# Patient Record
Sex: Male | Born: 1980 | Race: White | Hispanic: No | Marital: Married | Smoking: Former smoker
Health system: Southern US, Community
[De-identification: ages and names within clinical notes are randomized; demographics above are authoritative.]

## PROBLEM LIST (undated history)

## (undated) DIAGNOSIS — K219 Gastro-esophageal reflux disease without esophagitis: Secondary | ICD-10-CM

## (undated) DIAGNOSIS — I10 Essential (primary) hypertension: Secondary | ICD-10-CM

## (undated) DIAGNOSIS — Z8619 Personal history of other infectious and parasitic diseases: Secondary | ICD-10-CM

## (undated) DIAGNOSIS — J3089 Other allergic rhinitis: Secondary | ICD-10-CM

## (undated) DIAGNOSIS — J45909 Unspecified asthma, uncomplicated: Secondary | ICD-10-CM

## (undated) HISTORY — DX: Gastro-esophageal reflux disease without esophagitis: K21.9

## (undated) HISTORY — DX: Personal history of other infectious and parasitic diseases: Z86.19

## (undated) HISTORY — DX: Unspecified asthma, uncomplicated: J45.909

## (undated) HISTORY — PX: FOOT SURGERY: SHX648

## (undated) HISTORY — DX: Other allergic rhinitis: J30.89

---
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
## 2002-07-16 ENCOUNTER — Ambulatory Visit (HOSPITAL_BASED_OUTPATIENT_CLINIC_OR_DEPARTMENT_OTHER): Admission: RE | Admit: 2002-07-16 | Discharge: 2002-07-16 | Payer: Self-pay | Admitting: Orthopedic Surgery

## 2010-01-25 ENCOUNTER — Emergency Department (HOSPITAL_COMMUNITY): Admission: EM | Admit: 2010-01-25 | Discharge: 2010-01-25 | Payer: Self-pay | Admitting: Emergency Medicine

## 2010-02-04 ENCOUNTER — Emergency Department (HOSPITAL_COMMUNITY): Admission: EM | Admit: 2010-02-04 | Discharge: 2010-02-04 | Payer: Self-pay | Admitting: Emergency Medicine

## 2010-08-13 NOTE — Op Note (Signed)
NAME:  ADAIAH, MORKEN                        ACCOUNT NO.:  [id number]   MEDICAL RECORD NO.:  [id number]                   PATIENT TYPE:  AMB   LOCATION:  DSC                                  FACILITY:  [facility]   PHYSICIAN:  Leonides Grills, M.D.                  DATE OF BIRTH:  [birth year]   DATE OF PROCEDURE:  07/16/2002  DATE OF DISCHARGE:                                 OPERATIVE REPORT   PREOPERATIVE DIAGNOSES:  1. Right peroneus longus tendinitis.  2. Right peroneus brevis tendinitis.  3. Right calcaneal spur.   POSTOPERATIVE DIAGNOSES:  1. Right peroneus longus tendinitis.  2. Right peroneus brevis tendinitis.  3. Right calcaneal spur.   OPERATION:  1. Right peroneus longus tenosynovectomy.  2. Right peroneus brevis tenosynovectomy.  3. Excision of calcaneal peroneal tubercle.  4. Sural nerve neurolysis.   ANESTHESIA:  General endotracheal tube.   SURGEON:  Leonides Grills, M.D.   ASSISTANT:  Lianne Cure, P.A.-C.   ESTIMATED BLOOD LOSS:  Minimal.   TOURNIQUET TIME:  Approximately one hour.   COMPLICATIONS:  None.   DISPOSITION:  Stable to the postoperative recovery room.   INDICATIONS FOR PROCEDURE:  This is a 30 year old male who has had  longstanding lateral ankle pain which has been resistant to conservative  measures.  He was consented for the above procedures.  All risks, which  include infection, neurovascular injury, especially sural nerve neuroma,  persistent pain, worsening of pain, subluxation of the peroneal tendons, and  possible future care were all again explained and questions encouraged and  answered.   DESCRIPTION OF PROCEDURE:  The patient was brought to the operating room and  placed initially in the supine position.  After this general endotracheal  tube anesthesia was administered, as well as vancomycin 1 g IV piggyback.  The patient was then placed in a sloppy lateral position with a bean bag.  All bony prominences were well-padded.  The  right lower extremity was then  prepped and draped in a sterile manner over a proximally-placed thigh  tourniquet.  The limb was gravity-exsanguinated.  The tourniquet was  elevated to 290 mmHg.  A longitudinal incision over the peroneal tendon,  over the peroneal tubercle was then made.  Dissection was carried down  through the skin.  The sural nerve was identified and meticulously dissected  out, performing a neurolysis of the nerve.  Once this was done, and  retracted out of harm's way, the peroneal retinaculum was then encountered.  A longitudinal incision within the peroneal retinaculum was then made.  The  peroneus longus was first identified.  This was retracted out of harm's way.  There was a prominent tubercle that was palpable underneath the tendon.  There was a large amount of synovitis in this area, and this was  meticulously debrided.  The retinaculum was then opened over the peroneus  brevis.  Again there was a  large amount of synovitis in this area as well.  This was meticulously debrided as well.  The peroneal tubercle was then  removed with the aid of a curved 1/4 inch osteotome.  Once this was removed,  all edges were well-rounded with the rongeur.  Once this was done, the wound  was copiously irrigated with normal saline.  Once this was meticulously  irrigated, we then closed the retinaculum with #3-0 Vicryl.  The tourniquet  was deflated and hemostasis was obtained.  The skin was closed with #4-0  nylon.  Sterile dressings were applied.  A Cam walker boot was applied.  The patient was started to the postoperative recovery room.                                                 Leonides Grills, M.D.    PB/MEDQ  D:  07/16/2002  T:  07/16/2002  Job:  [id number]

## 2010-11-21 ENCOUNTER — Emergency Department (HOSPITAL_COMMUNITY)
Admission: EM | Admit: 2010-11-21 | Discharge: 2010-11-22 | Disposition: A | Discharge status: Home / Self Care | Payer: Self-pay | Attending: Emergency Medicine | Admitting: Emergency Medicine

## 2010-11-21 DIAGNOSIS — R05 Cough: Secondary | ICD-10-CM | POA: Insufficient documentation

## 2010-11-21 DIAGNOSIS — J45909 Unspecified asthma, uncomplicated: Secondary | ICD-10-CM | POA: Insufficient documentation

## 2010-11-21 DIAGNOSIS — R059 Cough, unspecified: Secondary | ICD-10-CM | POA: Insufficient documentation

## 2010-11-22 ENCOUNTER — Emergency Department (HOSPITAL_COMMUNITY): Payer: Self-pay

## 2011-03-29 HISTORY — PX: HAND SURGERY: SHX662

## 2011-07-16 ENCOUNTER — Emergency Department (INDEPENDENT_AMBULATORY_CARE_PROVIDER_SITE_OTHER): Payer: Self-pay

## 2011-07-16 ENCOUNTER — Encounter (HOSPITAL_COMMUNITY): Payer: Self-pay | Admitting: *Deleted

## 2011-07-16 ENCOUNTER — Emergency Department (INDEPENDENT_AMBULATORY_CARE_PROVIDER_SITE_OTHER)
Admission: EM | Admit: 2011-07-16 | Discharge: 2011-07-16 | Disposition: A | Discharge status: Home / Self Care | Payer: Self-pay | Source: Home / Self Care | Attending: Family Medicine | Admitting: Family Medicine

## 2011-07-16 DIAGNOSIS — S62339A Displaced fracture of neck of unspecified metacarpal bone, initial encounter for closed fracture: Secondary | ICD-10-CM

## 2011-07-16 MED ORDER — HYDROCODONE-ACETAMINOPHEN 5-500 MG PO TABS: 1.0000 | ORAL_TABLET | Freq: Four times a day (QID) | ORAL | Status: AC | PRN

## 2011-07-16 NOTE — ED Notes (Signed)
Pt injured at work last night lifting heavy pallet wood broke and posterior left hand hit wood now with swelling left hand

## 2011-07-16 NOTE — ED Provider Notes (Signed)
History     CSN: [id number]  Arrival date & time 07/16/11  1158   First MD Initiated Contact with Patient 07/16/11 1201      Chief Complaint  Patient presents with  . Hand Pain  . Hand Injury    (Consider location/radiation/quality/duration/timing/severity/associated sxs/prior treatment) Patient is a 31 y.o. male presenting with hand pain and hand injury. The history is provided by the patient.  Hand Pain This is a new problem. The current episode started yesterday (struck hand against wood palate on job last eve). The problem has been gradually worsening.  Hand Injury     Past Medical History  Diagnosis Date  . Environmental allergies   . Asthma     Past Surgical History  Procedure Date  . Wrist surgery   . Foot surgery     History reviewed. No pertinent family history.  History  Substance Use Topics  . Smoking status: Never Smoker   . Smokeless tobacco: Not on file  . Alcohol Use: Yes      Review of Systems  Constitutional: Negative.   Musculoskeletal: Positive for joint swelling.    Allergies  Penicillins  Home Medications   Current Outpatient Rx  Name Route Sig Dispense Refill  . IBUPROFEN 200 MG PO TABS Oral Take 600 mg by mouth every 6 (six) hours as needed.    Marland Kitchen LORATADINE 10 MG PO TABS Oral Take 10 mg by mouth daily.    . MOMETASONE FUROATE 220 MCG/INH IN AEPB Inhalation Inhale 2 puffs into the lungs daily.    Marland Kitchen HYDROCODONE-ACETAMINOPHEN 5-500 MG PO TABS Oral Take 1-2 tablets by mouth every 6 (six) hours as needed for pain. 15 tablet 0    BP 145/93  Pulse 80  Temp(Src) 99.2 F (37.3 C) (Oral)  Resp 17  SpO2 96%  Physical Exam  Nursing note and vitals reviewed. Musculoskeletal: He exhibits tenderness.       Tender hematoma sts over 5th mc of left hand, , skin intact, fingers intact.    ED Course  Procedures (including critical care time)  Labs Reviewed - No data to display Dg Hand Complete Left  07/16/2011  *RADIOLOGY REPORT*   Clinical Data: The patient hit hand at work.  Pain, soft tissue swelling fifth metacarpal phalangeal joint.  LEFT HAND - COMPLETE 3+ VIEW  Comparison: None.  Findings: There is a fracture of the distal fifth metacarpal with anterior angulation at the fracture site.  There is associated soft tissue swelling.  No radiopaque foreign body or soft tissue gas.  IMPRESSION: Fracture of the fifth metacarpal.  Original Report Authenticated By: Patterson Hammersmith, M.D.     1. Boxer's metacarpal fracture, neck, closed       MDM  X-rays reviewed and report per radiologist.  Discussed with dr Melvyn Novas, ,treat as advised.         Linna Hoff, MD 07/16/11 [phone]

## 2012-04-06 ENCOUNTER — Emergency Department (HOSPITAL_COMMUNITY)
Admission: EM | Admit: 2012-04-06 | Discharge: 2012-04-06 | Disposition: A | Discharge status: Home / Self Care | Payer: Managed Care, Other (non HMO) | Attending: Emergency Medicine | Admitting: Emergency Medicine

## 2012-04-06 ENCOUNTER — Encounter (HOSPITAL_COMMUNITY): Payer: Self-pay | Admitting: Emergency Medicine

## 2012-04-06 DIAGNOSIS — J45909 Unspecified asthma, uncomplicated: Secondary | ICD-10-CM | POA: Insufficient documentation

## 2012-04-06 DIAGNOSIS — IMO0002 Reserved for concepts with insufficient information to code with codable children: Secondary | ICD-10-CM | POA: Insufficient documentation

## 2012-04-06 DIAGNOSIS — H109 Unspecified conjunctivitis: Secondary | ICD-10-CM | POA: Insufficient documentation

## 2012-04-06 DIAGNOSIS — Z79899 Other long term (current) drug therapy: Secondary | ICD-10-CM | POA: Insufficient documentation

## 2012-04-06 MED ORDER — FLUORESCEIN SODIUM 1 MG OP STRP
1.0000 | ORAL_STRIP | Freq: Once | OPHTHALMIC | Status: AC
  Administered 2012-04-06: 1 via OPHTHALMIC
  Filled 2012-04-06: qty 1

## 2012-04-06 MED ORDER — TETRACAINE HCL 0.5 % OP SOLN
2.0000 [drp] | Freq: Once | OPHTHALMIC | Status: AC
  Administered 2012-04-06: 2 [drp] via OPHTHALMIC
  Filled 2012-04-06: qty 2

## 2012-04-06 MED ORDER — POLYMYXIN B-TRIMETHOPRIM 10000-0.1 UNIT/ML-% OP SOLN
1.0000 [drp] | Freq: Once | OPHTHALMIC | Status: AC
  Administered 2012-04-06: 1 [drp] via OPHTHALMIC
  Filled 2012-04-06 (×2): qty 10

## 2012-04-06 NOTE — ED Notes (Signed)
Pt st's he woke up this am with pain to left eye.  St's eye has been watering, painful to open

## 2012-04-06 NOTE — ED Provider Notes (Signed)
History   This chart was scribed for non-physician practitioner working with Suzi Roots, MD by Frederik Pear, ED Scribe. This patient was seen in room TR04C/TR04C and the patient's care was started at 1753.   CSN: [id number]  Arrival date & time 04/06/12  1714   First MD Initiated Contact with Patient 04/06/12 1753      Chief Complaint  Patient presents with  . Eye Pain    (Consider location/radiation/quality/duration/timing/severity/associated sxs/prior treatment) Patient is a 32 y.o. male presenting with eye pain. The history is provided by the patient. No language interpreter was used.  Eye Pain This is a new problem. The current episode started 6 to 12 hours ago. The problem occurs constantly. The problem has not changed since onset.Exacerbated by: Opening his eye. Nothing relieves the symptoms. He has tried nothing for the symptoms.    Shawn Davenport is a 32 y.o. male who presents to the Emergency Department complaining of constant, moderate left eye pain with associated watering and swelling that is aggravated by opening his eye and began when he awoke. He denies any known injury, headaches, visual disturbances, or that the eye was matted shut when he awoke. He denies recently being sick or having any sick contacts.  Past Medical History  Diagnosis Date  . Environmental allergies   . Asthma     Past Surgical History  Procedure Date  . Wrist surgery   . Foot surgery     No family history on file.  History  Substance Use Topics  . Smoking status: Never Smoker   . Smokeless tobacco: Not on file  . Alcohol Use: Yes      Review of Systems  Eyes: Positive for pain.  All other systems reviewed and are negative.    Allergies  Penicillins  Home Medications   Current Outpatient Rx  Name  Route  Sig  Dispense  Refill  . ALBUTEROL SULFATE HFA 108 (90 BASE) MCG/ACT IN AERS   Inhalation   Inhale 2 puffs into the lungs every 6 (six) hours as needed. For  wheezing/shortness of breath         . IBUPROFEN 200 MG PO TABS   Oral   Take 600 mg by mouth every 6 (six) hours as needed. For eye pain         . LORATADINE 10 MG PO TABS   Oral   Take 10 mg by mouth daily.         . MOMETASONE FURO-FORMOTEROL FUM 100-5 MCG/ACT IN AERO   Inhalation   Inhale 2 puffs into the lungs 2 (two) times daily.           BP 158/103  Pulse 75  Temp 98.3 F (36.8 C) (Oral)  Resp 16  SpO2 97%  Physical Exam  Nursing note and vitals reviewed. Constitutional: He is oriented to person, place, and time. He appears well-developed and well-nourished. No distress.  HENT:  Head: Normocephalic and atraumatic.  Eyes: EOM are normal. Pupils are equal, round, and reactive to light. Left conjunctiva is injected.       No abrasion are noted.  Neck: Neck supple. No tracheal deviation present.  Cardiovascular: Normal rate.   Pulmonary/Chest: Effort normal. No respiratory distress.  Musculoskeletal: Normal range of motion.  Neurological: He is alert and oriented to person, place, and time.  Skin: Skin is warm and dry.  Psychiatric: He has a normal mood and affect. His behavior is normal.    ED Course  Procedures (including critical care time)  DIAGNOSTIC STUDIES: Oxygen Saturation is 97% on room air, adequate by my interpretation.    COORDINATION OF CARE:  17:58- Discussed planned course of treatment with the patient, including including eye drops, who is agreeable at this time.  18:00- Medication Orders- tetracaine (pontocaine) 0.5% opthalmic solution 2 drop- once, fluorescein opthalmic strip 1 strip- once.  Labs Reviewed - No data to display No results found.   No diagnosis found.    MDM    I personally performed the services described in this documentation, which was scribed in my presence. The recorded information has been reviewed and is accurate.       Jimmye Norman, NP 04/07/12 0130

## 2012-04-06 NOTE — ED Notes (Signed)
Pt reports watering, itchy eye since this morning. Pain when opening eye. Denies any injury or trauma to eye. Vision intact. Reports this occurred earlier this week, then resolved on its own.

## 2012-04-08 NOTE — ED Provider Notes (Signed)
Medical screening examination/treatment/procedure(s) were performed by non-physician practitioner and as supervising physician I was immediately available for consultation/collaboration.   Suzi Roots, MD 04/08/12 1520

## 2013-07-04 ENCOUNTER — Encounter: Payer: Self-pay | Admitting: Family Medicine

## 2013-07-04 ENCOUNTER — Ambulatory Visit (INDEPENDENT_AMBULATORY_CARE_PROVIDER_SITE_OTHER): Payer: Managed Care, Other (non HMO) | Admitting: Family Medicine

## 2013-07-04 VITALS — BP 144/90 | HR 76 | Temp 98.3°F | Ht 68.75 in | Wt 245.5 lb

## 2013-07-04 DIAGNOSIS — J3089 Other allergic rhinitis: Secondary | ICD-10-CM

## 2013-07-04 DIAGNOSIS — J309 Allergic rhinitis, unspecified: Secondary | ICD-10-CM

## 2013-07-04 DIAGNOSIS — L255 Unspecified contact dermatitis due to plants, except food: Secondary | ICD-10-CM

## 2013-07-04 DIAGNOSIS — R030 Elevated blood-pressure reading, without diagnosis of hypertension: Secondary | ICD-10-CM

## 2013-07-04 DIAGNOSIS — L237 Allergic contact dermatitis due to plants, except food: Secondary | ICD-10-CM | POA: Insufficient documentation

## 2013-07-04 DIAGNOSIS — J45909 Unspecified asthma, uncomplicated: Secondary | ICD-10-CM

## 2013-07-04 DIAGNOSIS — IMO0001 Reserved for inherently not codable concepts without codable children: Secondary | ICD-10-CM | POA: Insufficient documentation

## 2013-07-04 DIAGNOSIS — E669 Obesity, unspecified: Secondary | ICD-10-CM | POA: Insufficient documentation

## 2013-07-04 MED ORDER — TRIAMCINOLONE ACETONIDE 0.1 % EX CREA: 1.0000 "application " | TOPICAL_CREAM | Freq: Two times a day (BID) | CUTANEOUS | Status: DC

## 2013-07-04 NOTE — Progress Notes (Signed)
Pre visit review using our clinic review tool, if applicable. No additional management support is needed unless otherwise documented below in the visit note. 

## 2013-07-04 NOTE — Assessment & Plan Note (Signed)
Continue claritin daily and nasonex prn.

## 2013-07-04 NOTE — Assessment & Plan Note (Signed)
Chronic, stable. Continue dulera and albuterol prn. [place]

## 2013-07-04 NOTE — Progress Notes (Signed)
BP 142/90  Pulse 76  Temp(Src) 98.3 F (36.8 C) (Oral)  Ht 5' 8.75" (1.746 m)  Wt 245 lb 8 oz (111.358 kg)  BMI 36.53 kg/m2  SpO2 98%   CC: new pt to establish  Subjective:    Patient ID: Shawn Davenport, male    DOB: [birth year], 33 y.o.   MRN: [id number]  HPI: Shawn Davenport is a 33 y.o. male presenting on 07/04/2013 for Establish Care   Asthma - on prn albuterol as well as daily dulera.  Sees asthma/allergy center in [place].  Recent asthma flare - with current URI.  No fevers/chills.  No nocturnal sxs.  Triggers are hairspray and allergens (ragweed, dogs cats and mold) Allergic rhinitis - takes nasonex prn as well as claritin 10mg  daily. HTN - last few times he has seen asthma doc, bp has been elevated.  Up to 150/101. Currently with poison ivy - bilateral forearms.  Recent yardwork at home.  Obesity - noted weight loss over last year.  Weight as high as 230lb, last weight 1 yr ago 220lbs. Wt Readings from Last 3 Encounters:  07/04/13 245 lb 8 oz (111.358 kg)   Body mass index is 36.53 kg/(m^2).   Preventative: No recent CPE Flu shot - did not receive Tetanus shot - unsure  Lives with wife and daughter (2013), 1 dog Occupation: Therapist, musicunloader for [employer] Edu: HS Activity - yardwork Diet - good water, gatorade 16 oz at work, fruits/vegetables daily  Relevant past medical, surgical, family and social history reviewed and updated as indicated.  Allergies and medications reviewed and updated. Current Outpatient Prescriptions on File Prior to Visit  Medication Sig  . albuterol (PROVENTIL HFA;VENTOLIN HFA) 108 (90 BASE) MCG/ACT inhaler Inhale 2 puffs into the lungs every 6 (six) hours as needed. For wheezing/shortness of breath  . ibuprofen (ADVIL,MOTRIN) 200 MG tablet Take 600 mg by mouth every 6 (six) hours as needed. For eye pain  . loratadine (CLARITIN) 10 MG tablet Take 10 mg by mouth daily.  . mometasone-formoterol (DULERA) 100-5 MCG/ACT AERO Inhale 2 puffs into the  lungs 2 (two) times daily.   No current facility-administered medications on file prior to visit.    Review of Systems Per HPI unless specifically indicated above    Objective:    BP 142/90  Pulse 76  Temp(Src) 98.3 F (36.8 C) (Oral)  Ht 5' 8.75" (1.746 m)  Wt 245 lb 8 oz (111.358 kg)  BMI 36.53 kg/m2  SpO2 98%  Physical Exam  Nursing note and vitals reviewed. Constitutional: He appears well-developed and well-nourished. No distress.  obese  HENT:  Head: Normocephalic and atraumatic.  Mouth/Throat: Oropharynx is clear and moist. No oropharyngeal exudate.  Eyes: Conjunctivae and EOM are normal. Pupils are equal, round, and reactive to light. No scleral icterus.  Neck: Normal range of motion. Neck supple. Carotid bruit is not present. No thyromegaly present.  Cardiovascular: Normal rate, regular rhythm, normal heart sounds and intact distal pulses.   No murmur heard. Pulmonary/Chest: Breath sounds normal. No respiratory distress. He has no wheezes. He has no rales.  Musculoskeletal: He exhibits no edema.  Lymphadenopathy:    He has no cervical adenopathy.  Skin: Skin is warm and dry. No rash noted.  Psychiatric: He has a normal mood and affect.   No results found for this or any previous visit.    Assessment & Plan:   Problem List Items Addressed This Visit   Poison ivy dermatitis - Primary  TCI cream today. Pt education handout provided.    Relevant Medications      [email or web address]   Perennial allergic rhinitis     Continue claritin daily and nasonex prn.    Asthma     Chronic, stable. Continue dulera and albuterol prn. Followed by allergy/asthma center in [place].    Elevated blood pressure     Reviewed low salt diet, discussed DASH and mediterranean diet, pt education handout provided. Reviewed recommended amt daily exercise 163min/wk.  Discussed slow titration up to goal.. rtc 3-4 mo for f/u blood pressure after healthy diet/lifestyle changes. See  pt instructions for plan.    Obesity     Encouraged weight loss through healthy diet/lifesytle changes.        Follow up plan: Return in about 3 months (around 10/03/2013), or as needed, for follow up.

## 2013-07-04 NOTE — Assessment & Plan Note (Signed)
Reviewed low salt diet, discussed DASH and mediterranean diet, pt education handout provided. Reviewed recommended amt daily exercise 14750min/wk.  Discussed slow titration up to goal.. rtc 3-4 mo for f/u blood pressure after healthy diet/lifestyle changes. See pt instructions for plan.

## 2013-07-04 NOTE — Patient Instructions (Addendum)
For poison ivy - use triamcinolone cream prescribed today.  May also use benadryl over the counter for night time itching. Your goal blood pressure is <140/90. Work on low salt/sodium diet - goal <1.5gm (1,500mg ) per day. Eat a diet high in fruits/vegetables and whole grains.  Look into mediterranean diet and DASH diet. Goal activity is 13250min/wk of moderate intensity exercise.  This can be split into 30 minute chunks.  If you are not at this level, you can start with smaller 10-15 min increments and slowly build up activity. Look at www.heart.org for more resources Return in 3-4 months for blood pressure recheck - if staying elevated, may discuss medicines for this.  Poison [employer] Poison ivy is a inflammation of the skin (contact dermatitis) caused by touching the allergens on the leaves of the ivy plant following previous exposure to the plant. The rash usually appears 48 hours after exposure. The rash is usually bumps (papules) or blisters (vesicles) in a linear pattern. Depending on your own sensitivity, the rash may simply cause redness and itching, or it may also progress to blisters which may break open. These must be well cared for to prevent secondary bacterial (germ) infection, followed by scarring. Keep any open areas dry, clean, dressed, and covered with an antibacterial ointment if needed. The eyes may also get puffy. The puffiness is worst in the morning and gets better as the day progresses. This dermatitis usually heals without scarring, within 2 to 3 weeks without treatment. HOME CARE INSTRUCTIONS  Thoroughly wash with soap and water as soon as you have been exposed to poison ivy. You have about one half hour to remove the plant resin before it will cause the rash. This washing will destroy the oil or antigen on the skin that is causing, or will cause, the rash. Be sure to wash under your fingernails as any plant resin there will continue to spread the rash. Do not rub skin vigorously when  washing affected area. Poison ivy cannot spread if no oil from the plant remains on your body. A rash that has progressed to weeping sores will not spread the rash unless you have not washed thoroughly. It is also important to wash any clothes you have been wearing as these may carry active allergens. The rash will return if you wear the unwashed clothing, even several days later. Avoidance of the plant in the future is the best measure. Poison ivy plant can be recognized by the number of leaves. Generally, poison ivy has three leaves with flowering branches on a single stem. Diphenhydramine may be purchased over the counter and used as needed for itching. Do not drive with this medication if it makes you drowsy.Ask your caregiver about medication for children. SEEK MEDICAL CARE IF:  Open sores develop.  Redness spreads beyond area of rash.  You notice purulent (pus-like) discharge.  You have increased pain.  Other signs of infection develop (such as fever). Document Released: 03/11/2000 Document Revised: 06/06/2011 Document Reviewed: 01/28/2009 [facility] Patient Information 2014 [place].  DASH Diet The DASH diet stands for "Dietary Approaches to Stop Hypertension." It is a healthy eating plan that has been shown to reduce high blood pressure (hypertension) in as little as 14 days, while also possibly providing other significant health benefits. These other health benefits include reducing the risk of breast cancer after menopause and reducing the risk of type 2 diabetes, heart disease, colon cancer, and stroke. Health benefits also include weight loss and slowing kidney failure in  patients with chronic kidney disease.  DIET GUIDELINES  Limit salt (sodium). Your diet should contain less than 1500 mg of sodium daily.  Limit refined or processed carbohydrates. Your diet should include mostly whole grains. Desserts and added sugars should be used sparingly.  Include small amounts of  heart-healthy fats. These types of fats include nuts, oils, and tub margarine. Limit saturated and trans fats. These fats have been shown to be harmful in the body. CHOOSING FOODS  The following food groups are based on a 2000 calorie diet. See your Registered Dietitian for individual calorie needs. Grains and Grain Products (6 to 8 servings daily)  Eat More Often: Whole-wheat bread, brown rice, whole-grain or wheat pasta, quinoa, popcorn without added fat or salt (air popped).  Eat Less Often: White bread, white pasta, white rice, cornbread. Vegetables (4 to 5 servings daily)  Eat More Often: Fresh, frozen, and canned vegetables. Vegetables may be raw, steamed, roasted, or grilled with a minimal amount of fat.  Eat Less Often/Avoid: Creamed or fried vegetables. Vegetables in a cheese sauce. Fruit (4 to 5 servings daily)  Eat More Often: All fresh, canned (in natural juice), or frozen fruits. Dried fruits without added sugar. One hundred percent fruit juice ( cup [237 mL] daily).  Eat Less Often: Dried fruits with added sugar. Canned fruit in light or heavy syrup. [employer], Fish, and Poultry (2 servings or less daily. One serving is 3 to 4 oz [85-114 g]).  Eat More Often: Ninety percent or leaner ground beef, tenderloin, sirloin. Round cuts of beef, chicken breast, [place] breast. All fish. Grill, bake, or broil your meat. Nothing should be fried.  Eat Less Often/Avoid: Fatty cuts of meat, [place], or chicken leg, thigh, or wing. Fried cuts of meat or fish. Dairy (2 to 3 servings)  Eat More Often: Low-fat or fat-free milk, low-fat plain or light yogurt, reduced-fat or part-skim cheese.  Eat Less Often/Avoid: Milk (whole, 2%).Whole milk yogurt. Full-fat cheeses. Nuts, Seeds, and Legumes (4 to 5 servings per week)  Eat More Often: All without added salt.  Eat Less Often/Avoid: Salted nuts and seeds, canned beans with added salt. Fats and Sweets (limited)  Eat More Often: Vegetable  oils, tub margarines without trans fats, sugar-free gelatin. Mayonnaise and salad dressings.  Eat Less Often/Avoid: Coconut oils, palm oils, butter, stick margarine, cream, half and half, cookies, candy, pie. FOR MORE INFORMATION The Dash Diet Eating Plan: www.dashdiet.org Document Released: 03/03/2011 Document Revised: 06/06/2011 Document Reviewed: 03/03/2011 [facility] Patient Information 2014 [place], [place].

## 2013-07-04 NOTE — Assessment & Plan Note (Signed)
TCI cream today. Pt education handout provided.

## 2013-07-04 NOTE — Assessment & Plan Note (Signed)
Encouraged weight loss through healthy diet/lifesytle changes.

## 2013-09-09 ENCOUNTER — Ambulatory Visit (INDEPENDENT_AMBULATORY_CARE_PROVIDER_SITE_OTHER)
Admission: RE | Admit: 2013-09-09 | Discharge: 2013-09-09 | Disposition: A | Discharge status: Home / Self Care | Payer: Managed Care, Other (non HMO) | Source: Ambulatory Visit | Attending: Family Medicine | Admitting: Family Medicine

## 2013-09-09 ENCOUNTER — Encounter: Payer: Self-pay | Admitting: *Deleted

## 2013-09-09 ENCOUNTER — Encounter: Payer: Self-pay | Admitting: Family Medicine

## 2013-09-09 ENCOUNTER — Ambulatory Visit (INDEPENDENT_AMBULATORY_CARE_PROVIDER_SITE_OTHER): Payer: Managed Care, Other (non HMO) | Admitting: Family Medicine

## 2013-09-09 VITALS — BP 120/80 | HR 68 | Temp 98.5°F | Ht 68.75 in | Wt 232.0 lb

## 2013-09-09 DIAGNOSIS — M79609 Pain in unspecified limb: Secondary | ICD-10-CM

## 2013-09-09 DIAGNOSIS — S93519A Sprain of interphalangeal joint of unspecified toe(s), initial encounter: Secondary | ICD-10-CM

## 2013-09-09 DIAGNOSIS — M79676 Pain in unspecified toe(s): Secondary | ICD-10-CM

## 2013-09-09 NOTE — Progress Notes (Signed)
Pre visit review using our clinic review tool, if applicable. No additional management support is needed unless otherwise documented below in the visit note. 

## 2013-09-09 NOTE — Progress Notes (Signed)
[address] [place] [address] Phone: [phone] Fax: [phone]  Patient ID: Burnadette PopDavid M Hallisey MRN: [id number], DOB: 07-23-1980, 33 y.o. Date of Encounter: 09/09/2013  Primary Physician:  Eustaquio BoydenJavier Gutierrez, MD   Chief Complaint: pain left big toe   Subjective:   History of Present Illness:  Burnadette PopDavid M Medaglia is a 33 y.o. very pleasant male patient who presents with the following:  L great toe. Has been exercising a lot more and one day it was a lot sore. Got a little sore. 2 weeks. He does not have any known he has been where he stubbed his toe or dropped anything on it at all. He does have some slight degree of bruising on the medial aspect. It is tender little bit her somewhat when he is walking.  Past Medical History, Surgical History, Social History, Family History, Problem List, Medications, and Allergies have been reviewed and updated if relevant.  Review of Systems:  GEN: No fevers, chills. Nontoxic. Primarily MSK c/o today. MSK: Detailed in the HPI GI: tolerating PO intake without difficulty Neuro: No numbness, parasthesias, or tingling associated. Otherwise the pertinent positives of the ROS are noted above.   Objective:   Physical Examination: BP 120/80  Pulse 68  Temp(Src) 98.5 F (36.9 C) (Oral)  Ht 5' 8.75" (1.746 m)  Wt 232 lb (105.235 kg)  BMI 34.52 kg/m2   GEN: WDWN, NAD, Non-toxic, Alert & Oriented x 3 HEENT: Atraumatic, Normocephalic.  Ears and Nose: No external deformity. EXTR: No clubbing/cyanosis/edema NEURO: Normal gait.  PSYCH: Normally interactive. Conversant. Not depressed or anxious appearing.  Calm demeanor.    The entirety of the ankle including all bony and soft tissue structures is nontender. The entire forefoot is nontender. The entire midfoot is nontender. Isolated tenderness only at the IP joint of the great toe on the LEFT. This is more notable medially and there is some bruising here as well. Flexion and extension are  intact.  Radiology: Dg Toe Great Left  09/09/2013   CLINICAL DATA:  Great toe pain  EXAM: LEFT GREAT TOE  COMPARISON:  None.  FINDINGS: There is no evidence of fracture or dislocation. There is no evidence of arthropathy or other focal bone abnormality. Soft tissues are unremarkable.  IMPRESSION: Negative.   Electronically Signed   By: Elige KoHetal  Patel   On: 09/09/2013 15:02    Assessment & Plan:   Interphalangeal (joint), toe sprain  Great toe pain - Plan: DG Toe Great Left  I do not have a good mechanism, but the patient and his pain isolate out to the IP joint of his 1st toe on the LEFT. This occurred approximately 2 weeks ago. It seems to be more medial compared to lateral when stressed, but all ligaments are intact. I reassured him, and he should improve with minimal effort on his part. I did buddy tape his toes on the 1st and 2nd together for comfort and gave him some tape.  Additionally, gave him some poor on to pad and make a bar around his 1st toe for comfort underneath his insole, which I think will make it more comfortable when he is working.  New Prescriptions   No medications on file   Modified Medications   No medications on file   Orders Placed This Encounter  Procedures  . DG Toe Great Left   Follow-up: No Follow-up on file. Unless noted above, the patient is to follow-up if symptoms worsen. Red flags were reviewed with the patient.  Signed,  Elpidio GaleaSpencer T. Tarhonda Hollenberg, MD, CAQ Sports Medicine   Discontinued Medications   No medications on file   Current Medications at Discharge:   Medication List       This list is accurate as of: 09/09/13 11:59 PM.  Always use your most recent med list.               albuterol 108 (90 BASE) MCG/ACT inhaler  Commonly known as:  PROVENTIL HFA;VENTOLIN HFA  Inhale 2 puffs into the lungs every 6 (six) hours as needed. For wheezing/shortness of breath     ibuprofen 200 MG tablet  Commonly known as:  ADVIL,MOTRIN  Take 600 mg by  mouth every 6 (six) hours as needed. For eye pain     loratadine 10 MG tablet  Commonly known as:  CLARITIN  Take 10 mg by mouth daily.     mometasone 50 MCG/ACT nasal spray  Commonly known as:  NASONEX  Place 2 sprays into the nose as needed.     mometasone-formoterol 100-5 MCG/ACT Aero  Commonly known as:  DULERA  Inhale 2 puffs into the lungs 2 (two) times daily.     triamcinolone cream 0.1 %  Commonly known as:  KENALOG  Apply 1 application topically 2 (two) times daily. Apply to AA.

## 2013-10-03 ENCOUNTER — Ambulatory Visit: Payer: Self-pay | Admitting: Family Medicine

## 2013-10-07 ENCOUNTER — Encounter: Payer: Self-pay | Admitting: Family Medicine

## 2013-10-07 ENCOUNTER — Ambulatory Visit (INDEPENDENT_AMBULATORY_CARE_PROVIDER_SITE_OTHER): Payer: Managed Care, Other (non HMO) | Admitting: Family Medicine

## 2013-10-07 VITALS — BP 130/92 | HR 64 | Temp 98.1°F | Wt 230.5 lb

## 2013-10-07 DIAGNOSIS — IMO0001 Reserved for inherently not codable concepts without codable children: Secondary | ICD-10-CM

## 2013-10-07 DIAGNOSIS — R030 Elevated blood-pressure reading, without diagnosis of hypertension: Secondary | ICD-10-CM

## 2013-10-07 DIAGNOSIS — E669 Obesity, unspecified: Secondary | ICD-10-CM

## 2013-10-07 NOTE — Progress Notes (Signed)
   BP 130/92  Pulse 64  Temp(Src) 98.1 F (36.7 C) (Oral)  Wt 230 lb 8 oz (104.554 kg)   CC: 3 mo f/u  Subjective:    Patient ID: Shawn Davenport M Roberg, male    DOB: 1981-02-05, 33 y.o.   MRN: [id number]  HPI: Shawn PopDavid M Sinning is a 33 y.o. male presenting on 10/07/2013 for Follow-up   See prior note for details. Established care earlier this year with noted elevated blood pressures. We discussed healthy diet and lifestyle changes and I asked him to return today for bp recheck. 15 weight loss in last 3 months! Has cut out red meat, eating fruits /vegetables, more chicken and fish. More water. 30 min exercise 5d/wk. Combination of cardio and weights.  Feeling great!   Has joined wife in making these healthy changes.  Wt Readings from Last 3 Encounters:  10/07/13 230 lb 8 oz (104.554 kg)  09/09/13 232 lb (105.235 kg)  07/04/13 245 lb 8 oz (111.358 kg)   Body mass index is 34.3 kg/(m^2).  BP Readings from Last 3 Encounters:  10/07/13 130/92  09/09/13 120/80  07/04/13 144/90    Relevant past medical, surgical, family and social history reviewed and updated as indicated.  Allergies and medications reviewed and updated. Current Outpatient Prescriptions on File Prior to Visit  Medication Sig  . albuterol (PROVENTIL HFA;VENTOLIN HFA) 108 (90 BASE) MCG/ACT inhaler Inhale 2 puffs into the lungs every 6 (six) hours as needed. For wheezing/shortness of breath  . ibuprofen (ADVIL,MOTRIN) 200 MG tablet Take 600 mg by mouth every 6 (six) hours as needed. For eye pain  . loratadine (CLARITIN) 10 MG tablet Take 10 mg by mouth daily.  . mometasone (NASONEX) 50 MCG/ACT nasal spray Place 2 sprays into the nose as needed.  . mometasone-formoterol (DULERA) 100-5 MCG/ACT AERO Inhale 2 puffs into the lungs 2 (two) times daily.   No current facility-administered medications on file prior to visit.    Review of Systems Per HPI unless specifically indicated above    Objective:    BP 130/92  Pulse 64   Temp(Src) 98.1 F (36.7 C) (Oral)  Wt 230 lb 8 oz (104.554 kg)  Physical Exam  Nursing note and vitals reviewed. Constitutional: He appears well-developed and well-nourished. No distress.  HENT:  Mouth/Throat: Oropharynx is clear and moist. No oropharyngeal exudate.  Cardiovascular: Normal rate, regular rhythm, normal heart sounds and intact distal pulses.   No murmur heard. Pulmonary/Chest: Effort normal and breath sounds normal. No respiratory distress. He has no wheezes. He has no rales.  Musculoskeletal: He exhibits no edema.   No results found for this or any previous visit.    Assessment & Plan:   Problem List Items Addressed This Visit   Obesity     Congratulated on 15 lb weight loss in last 3 months, encouraged continued efforts for sustained healthy lifestyle.    Elevated blood pressure - Primary     Doing great with lifestyle/diet changes - bp better controlled, no need for medications now. rtc 1 yr CPE.        Follow up plan: Return in about 1 year (around 10/08/2014), or as needed, for physical.

## 2013-10-07 NOTE — Assessment & Plan Note (Signed)
Doing great with lifestyle/diet changes - bp better controlled, no need for medications now. rtc 1 yr CPE.

## 2013-10-07 NOTE — Patient Instructions (Signed)
Keep up the good work! 15 lbs down in 3 months. Return as needed or in 1 year for a physical/checkup. Blood pressure is looking much better!

## 2013-10-07 NOTE — Progress Notes (Signed)
Pre visit review using our clinic review tool, if applicable. No additional management support is needed unless otherwise documented below in the visit note. 

## 2013-10-07 NOTE — Assessment & Plan Note (Signed)
Congratulated on 15 lb weight loss in last 3 months, encouraged continued efforts for sustained healthy lifestyle.

## 2014-10-22 ENCOUNTER — Ambulatory Visit (INDEPENDENT_AMBULATORY_CARE_PROVIDER_SITE_OTHER): Payer: Managed Care, Other (non HMO) | Admitting: Primary Care

## 2014-10-22 ENCOUNTER — Encounter: Payer: Self-pay | Admitting: Primary Care

## 2014-10-22 VITALS — BP 136/92 | HR 81 | Temp 98.0°F | Ht 68.75 in | Wt 257.8 lb

## 2014-10-22 DIAGNOSIS — M722 Plantar fascial fibromatosis: Secondary | ICD-10-CM

## 2014-10-22 NOTE — Progress Notes (Signed)
Subjective:    Patient ID: Shawn Davenport, male    DOB: [birth year], 34 y.o.   MRN: [id number]  HPI  [name] is a 34 year old male who presents today with a chief complaint of foot pain. His foot pain is located to the left lateral and plantar surface of foot. His pain has been present since Monday upon waking as soon as he put pressure on his foot. Throughout the day his pain will improve. Over the week his pain has progressed each morning. He could hardly walk on his foot this morning as he describes his pain as sharp and shooting. He took ibuprofen 800 mg once on Monday without relief. Denies recent injury.  Review of Systems  Musculoskeletal: Negative for joint swelling and arthralgias.       Pain to left lateral and plantar surface of foot.  Skin: Negative for color change and rash.       Past Medical History  Diagnosis Date  . Perennial allergic rhinitis     ragweed, dogs, cats, mold  . Asthma   . GERD (gastroesophageal reflux disease)     tums  . History of chicken pox     34yrs old    History   Social History  . Marital Status: Married    Spouse Name: N/A  . Number of Children: N/A  . Years of Education: N/A   Occupational History  . Not on file.   Social History Main Topics  . Smoking status: Former Smoker    Quit date: 07/04/2008  . Smokeless tobacco: Never Used  . Alcohol Use: Yes     Comment: once a week  . Drug Use: No  . Sexual Activity: Not on file   Other Topics Concern  . Not on file   Social History Narrative   Lives with wife and daughter (2013), 1 dog   Occupation: Therapist, music for [employer]   Edu: HS   Activity - yardwork   Diet - good water, gatorade 16 oz at work, fruits/vegetables daily    Past Surgical History  Procedure Laterality Date  . Hand surgery Left 2013    pins for broken 5th MCP  . Foot surgery Right   . Wrist surgery Left 1999    Family History  Problem Relation Age of Onset  . Cancer Mother 44    breast,  bone  . Hyperlipidemia Father   . Hypertension Father   . Diabetes Father   . Diabetes Paternal Uncle   . Hyperlipidemia Paternal Grandfather   . Hypertension Paternal Grandfather     Allergies  Allergen Reactions  . Penicillins Other (See Comments)    pneumonia     Current Outpatient Prescriptions on File Prior to Visit  Medication Sig Dispense Refill  . ibuprofen (ADVIL,MOTRIN) 200 MG tablet Take 600 mg by mouth every 6 (six) hours as needed. For eye pain    . loratadine (CLARITIN) 10 MG tablet Take 10 mg by mouth daily.     No current facility-administered medications on file prior to visit.    BP 136/92 mmHg  Pulse 81  Temp(Src) 98 F (36.7 C) (Oral)  Ht 5' 8.75" (1.746 m)  Wt 257 lb 12.8 oz (116.937 kg)  BMI 38.36 kg/m2  SpO2 98%    Objective:   Physical Exam  Cardiovascular: Normal rate and regular rhythm.   Pulmonary/Chest: Effort normal and breath sounds normal.  Musculoskeletal: Normal range of motion.  Minor tenderness to left lateral and  plantar surface of foot during exam.  Skin: Skin is warm and dry. No rash noted. No erythema.          Assessment & Plan:  Plantar fasciitis:  Present to left lateral and plantar surface of foot since Monday. Pain worse when first applying pressure in the morning. Denies recent inury. Treat with ibuprofen 800 mg TID, rest, ice, comfortable/supportive shoes, and insoles. Work note provided for tomorrow. Patient to follow up PRN.

## 2014-10-22 NOTE — Progress Notes (Signed)
Pre visit review using our clinic review tool, if applicable. No additional management support is needed unless otherwise documented below in the visit note. 

## 2014-10-22 NOTE — Patient Instructions (Signed)
You may take ibuprofen 800 mg three times daily as needed for pain.  If ibuprofen doesn't help to relieve you pain, try Naproxen 480 mg (which is 2 of the  tablets over the counter). Follow the instructions on the bottle.  It was nice to meet you!  Plantar Fasciitis Plantar fasciitis is a common condition that causes foot pain. It is soreness (inflammation) of the band of tough fibrous tissue on the bottom of the foot that runs from the heel bone (calcaneus) to the ball of the foot. The cause of this soreness may be from excessive standing, poor fitting shoes, running on hard surfaces, being overweight, having an abnormal walk, or overuse (this is common in runners) of the painful foot or feet. It is also common in aerobic exercise dancers and ballet dancers. SYMPTOMS  Most people with plantar fasciitis complain of:  Severe pain in the morning on the bottom of their foot especially when taking the first steps out of bed. This pain recedes after a few minutes of walking.  Severe pain is experienced also during walking following a long period of inactivity.  Pain is worse when walking barefoot or up stairs DIAGNOSIS   Your caregiver will diagnose this condition by examining and feeling your foot.  Special tests such as X-rays of your foot, are usually not needed. PREVENTION   Consult a sports medicine professional before beginning a new exercise program.  Walking programs offer a good workout. With walking there is a lower chance of overuse injuries common to runners. There is less impact and less jarring of the joints.  Begin all new exercise programs slowly. If problems or pain develop, decrease the amount of time or distance until you are at a comfortable level.  Wear good shoes and replace them regularly.  Stretch your foot and the heel cords at the back of the ankle (Achilles tendon) both before and after exercise.  Run or exercise on even surfaces that are not hard. For  example, asphalt is better than pavement.  Do not run barefoot on hard surfaces.  If using a treadmill, vary the incline.  Do not continue to workout if you have foot or joint problems. Seek professional help if they do not improve. HOME CARE INSTRUCTIONS   Avoid activities that cause you pain until you recover.  Use ice or cold packs on the problem or painful areas after working out.  Only take over-the-counter or prescription medicines for pain, discomfort, or fever as directed by your caregiver.  Soft shoe inserts or athletic shoes with air or gel sole cushions may be helpful.  If problems continue or become more severe, consult a sports medicine caregiver or your own health care provider. Cortisone is a potent anti-inflammatory medication that may be injected into the painful area. You can discuss this treatment with your caregiver. MAKE SURE YOU:   Understand these instructions.  Will watch your condition.  Will get help right away if you are not doing well or get worse. Document Released: 12/07/2000 Document Revised: 06/06/2011 Document Reviewed: 02/06/2008 [facility] Patient Information 2015 [place], [place]. This information is not intended to replace advice given to you by your health care provider. Make sure you discuss any questions you have with your health care provider.

## 2014-12-01 ENCOUNTER — Emergency Department (HOSPITAL_COMMUNITY)
Admission: EM | Admit: 2014-12-01 | Discharge: 2014-12-02 | Disposition: A | Discharge status: Home / Self Care | Payer: Worker's Compensation | Attending: Emergency Medicine | Admitting: Emergency Medicine

## 2014-12-01 ENCOUNTER — Encounter (HOSPITAL_COMMUNITY): Payer: Self-pay | Admitting: Emergency Medicine

## 2014-12-01 DIAGNOSIS — J45909 Unspecified asthma, uncomplicated: Secondary | ICD-10-CM | POA: Diagnosis not present

## 2014-12-01 DIAGNOSIS — Z87891 Personal history of nicotine dependence: Secondary | ICD-10-CM | POA: Insufficient documentation

## 2014-12-01 DIAGNOSIS — Y9389 Activity, other specified: Secondary | ICD-10-CM | POA: Diagnosis not present

## 2014-12-01 DIAGNOSIS — Y9289 Other specified places as the place of occurrence of the external cause: Secondary | ICD-10-CM | POA: Insufficient documentation

## 2014-12-01 DIAGNOSIS — Y990 Civilian activity done for income or pay: Secondary | ICD-10-CM | POA: Insufficient documentation

## 2014-12-01 DIAGNOSIS — Z8719 Personal history of other diseases of the digestive system: Secondary | ICD-10-CM | POA: Insufficient documentation

## 2014-12-01 DIAGNOSIS — Z79899 Other long term (current) drug therapy: Secondary | ICD-10-CM | POA: Diagnosis not present

## 2014-12-01 DIAGNOSIS — S39012A Strain of muscle, fascia and tendon of lower back, initial encounter: Secondary | ICD-10-CM | POA: Insufficient documentation

## 2014-12-01 DIAGNOSIS — X58XXXA Exposure to other specified factors, initial encounter: Secondary | ICD-10-CM | POA: Diagnosis not present

## 2014-12-01 DIAGNOSIS — Z880 Allergy status to penicillin: Secondary | ICD-10-CM | POA: Diagnosis not present

## 2014-12-01 DIAGNOSIS — Z8619 Personal history of other infectious and parasitic diseases: Secondary | ICD-10-CM | POA: Diagnosis not present

## 2014-12-01 DIAGNOSIS — S3992XA Unspecified injury of lower back, initial encounter: Secondary | ICD-10-CM | POA: Diagnosis present

## 2014-12-01 NOTE — ED Notes (Signed)
Pt injured lower back (mainly on left side) at work today [employer]. Ambulatory with steady gait. No previous back surgeries. No other c/c.

## 2014-12-02 MED ORDER — OXYCODONE-ACETAMINOPHEN 5-325 MG PO TABS: 1.0000 | ORAL_TABLET | ORAL | Status: DC | PRN

## 2014-12-02 MED ORDER — IBUPROFEN 800 MG PO TABS: 800.0000 mg | ORAL_TABLET | Freq: Three times a day (TID) | ORAL | Status: DC

## 2014-12-02 MED ORDER — METHOCARBAMOL 500 MG PO TABS: 1000.0000 mg | ORAL_TABLET | Freq: Two times a day (BID) | ORAL | Status: DC

## 2014-12-02 NOTE — ED Provider Notes (Signed)
CSN: [id number]     Arrival date & time 12/01/14  2300 History   First MD Initiated Contact with Patient 12/01/14 2341     Chief Complaint  Patient presents with  . Back Injury     (Consider location/radiation/quality/duration/timing/severity/associated sxs/prior Treatment) Patient is a 34 y.o. male presenting with back pain. The history is provided by the patient. No language interpreter was used.  Back Pain Location:  Lumbar spine Quality:  Aching Radiates to:  Does not radiate Pain severity:  Moderate Onset quality:  Sudden Duration:  2 hours Progression:  Worsening Chronicity:  New Associated symptoms: no abdominal pain, no fever, no numbness and no weakness   Associated symptoms comment:  Patient with no significant chronic conditions presents with low back pain after heavy lifting at work earlier [employer]. No radiating pain. No saddle anesthesia or urinary/bowel incontinence. No abdominal pain.   Past Medical History  Diagnosis Date  . Perennial allergic rhinitis     ragweed, dogs, cats, mold  . Asthma   . GERD (gastroesophageal reflux disease)     tums  . History of chicken pox     34yrs old   Past Surgical History  Procedure Laterality Date  . Hand surgery Left 2013    pins for broken 5th MCP  . Foot surgery Right   . Wrist surgery Left 1999   Family History  Problem Relation Age of Onset  . Cancer Mother 68    breast, bone  . Hyperlipidemia Father   . Hypertension Father   . Diabetes Father   . Diabetes Paternal Uncle   . Hyperlipidemia Paternal Grandfather   . Hypertension Paternal Grandfather    Social History  Substance Use Topics  . Smoking status: Former Smoker    Quit date: 07/04/2008  . Smokeless tobacco: Never Used  . Alcohol Use: Yes     Comment: once a week    Review of Systems  Constitutional: Negative for fever and chills.  Gastrointestinal: Negative.  Negative for abdominal pain.  Genitourinary: Negative for enuresis.   Musculoskeletal: Positive for back pain.  Skin: Negative.   Neurological: Negative.  Negative for weakness and numbness.      Allergies  Penicillins  Home Medications   Prior to Admission medications   Medication Sig Start Date End Date Taking? Authorizing Provider  albuterol (PROAIR HFA) 108 (90 BASE) MCG/ACT inhaler Inhale 2 puffs into the lungs every 6 (six) hours as needed for wheezing or shortness of breath.    Historical Provider, MD  Fluticasone-Salmeterol (ADVAIR DISKUS) 100-50 MCG/DOSE AEPB Inhale 1 puff into the lungs 2 (two) times daily.    Historical Provider, MD  ibuprofen (ADVIL,MOTRIN) 200 MG tablet Take 600 mg by mouth every 6 (six) hours as needed. For eye pain    Historical Provider, MD  loratadine (CLARITIN) 10 MG tablet Take 10 mg by mouth daily.    Historical Provider, MD   BP 159/92 mmHg  Pulse 105  Temp(Src) 98.4 F (36.9 C) (Oral)  Resp 17  SpO2 100% Physical Exam  Constitutional: He is oriented to person, place, and time. He appears well-developed and well-nourished.  Neck: Normal range of motion.  Cardiovascular: Intact distal pulses.   Pulmonary/Chest: Effort normal.  Abdominal: There is no tenderness.  Musculoskeletal: Normal range of motion.  No reproducible lower back tenderness. No swelling.   Neurological: He is alert and oriented to person, place, and time.  Reflexes are equal in bilateral LE's.   Skin: Skin is warm and  dry.  Psychiatric: He has a normal mood and affect.    ED Course  Procedures (including critical care time) Labs Review Labs Reviewed - No data to display  Imaging Review No results found. I have personally reviewed and evaluated these images and lab results as part of my medical decision-making.   EKG Interpretation None      MDM   Final diagnoses:  None    1. Low back strain  No neurologic deficits. Suspect muscular strain after heavy lifting requiring supportive care.     Elpidio Anis,  PA-C 12/02/14 0023  Tomasita Crumble, MD 12/02/14 [phone]

## 2014-12-02 NOTE — ED Notes (Signed)
AVS explained in detail. Given work note. Has all worker's compensation documents. No other c/c.

## 2014-12-02 NOTE — Discharge Instructions (Signed)
Back Pain, Adult °Low back pain is very common. About 1 in 5 people have back pain. The cause of low back pain is rarely dangerous. The pain often gets better over time. About half of people with a sudden onset of back pain feel better in just 2 weeks. About 8 in 10 people feel better by 6 weeks.  °CAUSES °Some common causes of back pain include: °· Strain of the muscles or ligaments supporting the spine. °· Wear and tear (degeneration) of the spinal discs. °· Arthritis. °· Direct injury to the back. °DIAGNOSIS °Most of the time, the direct cause of low back pain is not known. However, back pain can be treated effectively even when the exact cause of the pain is unknown. Answering your caregiver's questions about your overall health and symptoms is one of the most accurate ways to make sure the cause of your pain is not dangerous. If your caregiver needs more information, he or she may order lab work or imaging tests (X-rays or MRIs). However, even if imaging tests show changes in your back, this usually does not require surgery. °HOME CARE INSTRUCTIONS °For many people, back pain returns. Since low back pain is rarely dangerous, it is often a condition that people can learn to manage on their own.  °· Remain active. It is stressful on the back to sit or stand in one place. Do not sit, drive, or stand in one place for more than 30 minutes at a time. Take short walks on level surfaces as soon as pain allows. Try to increase the length of time you walk each day. °· Do not stay in bed. Resting more than 1 or 2 days can delay your recovery. °· Do not avoid exercise or work. Your body is made to move. It is not dangerous to be active, even though your back may hurt. Your back will likely heal faster if you return to being active before your pain is gone. °· Pay attention to your body when you  bend and lift. Many people have less discomfort when lifting if they bend their knees, keep the load close to their bodies, and  avoid twisting. Often, the most comfortable positions are those that put less stress on your recovering back. °· Find a comfortable position to sleep. Use a firm mattress and lie on your side with your knees slightly bent. If you lie on your back, put a pillow under your knees. °· Only take over-the-counter or prescription medicines as directed by your caregiver. Over-the-counter medicines to reduce pain and inflammation are often the most helpful. Your caregiver may prescribe muscle relaxant drugs. These medicines help dull your pain so you can more quickly return to your normal activities and healthy exercise. °· Put ice on the injured area. °¨ Put ice in a plastic bag. °¨ Place a towel between your skin and the bag. °¨ Leave the ice on for 15-20 minutes, 03-04 times a day for the first 2 to 3 days. After that, ice and heat may be alternated to reduce pain and spasms. °· Ask your caregiver about trying back exercises and gentle massage. This may be of some benefit. °· Avoid feeling anxious or stressed. Stress increases muscle tension and can worsen back pain. It is important to recognize when you are anxious or stressed and learn ways to manage it. Exercise is a great option. °SEEK MEDICAL CARE IF: °· You have pain that is not relieved with rest or medicine. °· You have pain that does not improve in 1 week. °· You have new symptoms. °· You are generally not feeling well. °SEEK   IMMEDIATE MEDICAL CARE IF:  °· You have pain that radiates from your back into your legs. °· You develop new bowel or bladder control problems. °· You have unusual weakness or numbness in your arms or legs. °· You develop nausea or vomiting. °· You develop abdominal pain. °· You feel faint. °Document Released: 03/14/2005 Document Revised: 09/13/2011 Document Reviewed: 07/16/2013 °ExitCare® Patient Information ©2015 [employer]. This information is not intended to replace advice given to you by your health care provider. Make sure you  discuss any questions you have with your health care provider. °Cryotherapy °Cryotherapy means treatment with cold. Ice or gel packs can be used to reduce both pain and swelling. Ice is the most helpful within the first 24 to 48 hours after an injury or flare-up from overusing a muscle or joint. Sprains, strains, spasms, burning pain, shooting pain, and aches can all be eased with ice. Ice can also be used when recovering from surgery. Ice is effective, has very few side effects, and is safe for most people to use. °PRECAUTIONS  °Ice is not a safe treatment option for people with: °· Raynaud phenomenon. This is a condition affecting small blood vessels in the extremities. Exposure to cold may cause your problems to return. °· Cold hypersensitivity. There are many forms of cold hypersensitivity, including: °¨ Cold urticaria. Red, itchy hives appear on the skin when the tissues begin to warm after being iced. °¨ Cold erythema. This is a red, itchy rash caused by exposure to cold. °¨ Cold hemoglobinuria. Red blood cells break down when the tissues begin to warm after being iced. The hemoglobin that carry oxygen are passed into the urine because they cannot combine with blood proteins fast enough. °· Numbness or altered sensitivity in the area being iced. °If you have any of the following conditions, do not use ice until you have discussed cryotherapy with your caregiver: °· Heart conditions, such as arrhythmia, angina, or chronic heart disease. °· High blood pressure. °· Healing wounds or open skin in the area being iced. °· Current infections. °· Rheumatoid arthritis. °· Poor circulation. °· Diabetes. °Ice slows the blood flow in the region it is applied. This is beneficial when trying to stop inflamed tissues from spreading irritating chemicals to surrounding tissues. However, if you expose your skin to cold temperatures for too long or without the proper protection, you can damage your skin or nerves. Watch for  signs of skin damage due to cold. °HOME CARE INSTRUCTIONS °Follow these tips to use ice and cold packs safely. °· Place a dry or damp towel between the ice and skin. A damp towel will cool the skin more quickly, so you may need to shorten the time that the ice is used. °· For a more rapid response, add gentle compression to the ice. °· Ice for no more than 10 to 20 minutes at a time. The bonier the area you are icing, the less time it will take to get the benefits of ice. °· Check your skin after 5 minutes to make sure there are no signs of a poor response to cold or skin damage. °· Rest 20 minutes or more between uses. °· Once your skin is numb, you can end your treatment. You can test numbness by very lightly touching your skin. The touch should be so light that you do not see the skin dimple from the pressure of your fingertip. When using ice, most people will feel these normal sensations in this order: cold,   burning, aching, and numbness. °· Do not use ice on someone who cannot communicate their responses to pain, such as small children or people with dementia. °HOW TO MAKE AN ICE PACK °Ice packs are the most common way to use ice therapy. Other methods include ice massage, ice baths, and cryosprays. Muscle creams that cause a cold, tingly feeling do not offer the same benefits that ice offers and should not be used as a substitute unless recommended by your caregiver. °To make an ice pack, do one of the following: °· Place crushed ice or a bag of frozen vegetables in a sealable plastic bag. Squeeze out the excess air. Place this bag inside another plastic bag. Slide the bag into a pillowcase or place a damp towel between your skin and the bag. °· Mix 3 parts water with 1 part rubbing alcohol. Freeze the mixture in a sealable plastic bag. When you remove the mixture from the freezer, it will be slushy. Squeeze out the excess air. Place this bag inside another plastic bag. Slide the bag into a pillowcase or place  a damp towel between your skin and the bag. °SEEK MEDICAL CARE IF: °· You develop white spots on your skin. This may give the skin a blotchy (mottled) appearance. °· Your skin turns blue or pale. °· Your skin becomes waxy or hard. °· Your swelling gets worse. °MAKE SURE YOU:  °· Understand these instructions. °· Will watch your condition. °· Will get help right away if you are not doing well or get worse. °Document Released: 11/08/2010 Document Revised: 07/29/2013 Document Reviewed: 11/08/2010 °ExitCare® Patient Information ©2015 [employer]. This information is not intended to replace advice given to you by your health care provider. Make sure you discuss any questions you have with your health care provider. ° °

## 2015-01-22 ENCOUNTER — Other Ambulatory Visit: Payer: Self-pay | Admitting: Occupational Medicine

## 2015-01-22 ENCOUNTER — Ambulatory Visit: Payer: Self-pay

## 2015-01-22 DIAGNOSIS — M25572 Pain in left ankle and joints of left foot: Secondary | ICD-10-CM

## 2015-03-25 ENCOUNTER — Ambulatory Visit (INDEPENDENT_AMBULATORY_CARE_PROVIDER_SITE_OTHER): Payer: Managed Care, Other (non HMO) | Admitting: Allergy and Immunology

## 2015-03-25 ENCOUNTER — Encounter: Payer: Self-pay | Admitting: Allergy and Immunology

## 2015-03-25 VITALS — BP 130/82 | HR 72 | Temp 98.7°F | Resp 16

## 2015-03-25 DIAGNOSIS — J3089 Other allergic rhinitis: Secondary | ICD-10-CM

## 2015-03-25 DIAGNOSIS — J45901 Unspecified asthma with (acute) exacerbation: Secondary | ICD-10-CM | POA: Insufficient documentation

## 2015-03-25 DIAGNOSIS — J309 Allergic rhinitis, unspecified: Secondary | ICD-10-CM | POA: Diagnosis not present

## 2015-03-25 MED ORDER — FLUTICASONE PROPIONATE 50 MCG/ACT NA SUSP: 1.0000 | Freq: Two times a day (BID) | NASAL | Status: DC | PRN

## 2015-03-25 MED ORDER — IPRATROPIUM-ALBUTEROL 0.5-2.5 (3) MG/3ML IN SOLN
3.0000 mL | Freq: Once | RESPIRATORY_TRACT | Status: AC
  Administered 2015-03-25: 3 mL via RESPIRATORY_TRACT

## 2015-03-25 MED ORDER — ALBUTEROL SULFATE HFA 108 (90 BASE) MCG/ACT IN AERS: INHALATION_SPRAY | RESPIRATORY_TRACT | Status: DC

## 2015-03-25 MED ORDER — PREDNISONE 1 MG PO TABS: 10.0000 mg | ORAL_TABLET | ORAL | Status: DC

## 2015-03-25 NOTE — Assessment & Plan Note (Signed)
   Prednisone has been provided, 40 mg x3 days, 20 mg x1 day, 10 mg x1 day, then stop.  Continue Advair HFA 115/21 g, 2 inhalations twice a day.  To maximize pulmonary deposition, a spacer has been provided along with instructions for its proper administration with an HFA inhaler.  Continue albuterol HFA every 4-6 hours as needed.  The patient has been asked to contact me if his symptoms persist or progress. Otherwise, he may return for follow up in 4 months.

## 2015-03-25 NOTE — Assessment & Plan Note (Signed)
   Prednisone has been provided (as above).  A prescription has been provided for fluticasone nasal spray, 2 sprays per nostril daily as needed. Proper nasal spray technique has been discussed and demonstrated.  Nasal saline lavage (NeilMed) as needed has been recommended along with instructions for proper administration.  Continue allergen avoidance measures.

## 2015-03-25 NOTE — Progress Notes (Signed)
RE: Shawn Davenport MRN: [id number] DOB: 1980/04/07 [facility] [place] [address] [place] [address] Date of Office Visit: 03/25/2015  Referring provider: Eustaquio Boyden, MD [address] [place], [place] [address]  History of present illness: HPI Comments: Shawn Davenport is a 34 y.o. male with persistent asthma and allergic rhinitis who presents today for sick visit.  He was previously seen in this office in January 2016 by Dr. Lucie Leather.  He reports that over the past week he has experienced coughing, dyspnea, and wheezing.  These symptoms have resulted in missed work yesterday,increased albuterol requirement, and nocturnal awakenings. He has also experienced severe nasal congestion and postnasal drainage over the past week.  He denies fevers or chills.   Assessment and plan: Asthma with acute exacerbation  Prednisone has been provided, 40 mg x3 days, 20 mg x1 day, 10 mg x1 day, then stop.  Continue Advair HFA 115/21 g, 2 inhalations twice a day.  To maximize pulmonary deposition, a spacer has been provided along with instructions for its proper administration with an HFA inhaler.  Continue albuterol HFA every 4-6 hours as needed.  The patient has been asked to contact me if his symptoms persist or progress. Otherwise, he may return for follow up in 4 months.  Perennial allergic rhinitis  Prednisone has been provided (as above).  A prescription has been provided for fluticasone nasal spray, 2 sprays per nostril daily as needed. Proper nasal spray technique has been discussed and demonstrated.  Nasal saline lavage (NeilMed) as needed has been recommended along with instructions for proper administration.  Continue allergen avoidance measures.    Medications ordered this encounter: Meds ordered this encounter  Medications  . albuterol (PROAIR HFA) 108 (90 Base) MCG/ACT inhaler    Sig: TWO PUFFS EVERY  4-6 HOURS AS NEEDED FOR COUGH OR WHEEZE.    Dispense:  1 Inhaler    Refill:  1  . ipratropium-albuterol (DUONEB) 0.5-2.5 (3) MG/3ML nebulizer solution 3 mL    Sig:   . predniSONE (DELTASONE) tablet 10 mg    Sig:   . fluticasone (FLONASE) 50 MCG/ACT nasal spray    Sig: Place 1 spray into both nostrils 2 (two) times daily as needed for allergies or rhinitis.    Dispense:  16 g    Refill:  3    Diagnositics: Spirometry reveals FVC of 4.68 L and an FEV1 of 3.53 L (86% predicted) without significant post bronchodilator improvement.    Physical examination: Blood pressure 130/82, pulse 72, temperature 98.7 F (37.1 C), temperature source Oral, resp. rate 16.  General: Alert, interactive, in no acute distress. HEENT: TMs pearly gray, turbinates markedly edematous with thick discharge, post-pharynx erythematous. Neck: Supple without lymphadenopathy. Lungs: Clear to auscultation without wheezing, rhonchi or rales. CV: Normal S1, S2 without murmurs. Skin: Warm and dry, without lesions or rashes.  The following portions of the patient's history were reviewed and updated as appropriate: allergies, current medications, past family history, past medical history, past social history, past surgical history and problem list.  Outpatient medications:   Medication List       This list is accurate as of: 03/25/15  1:43 PM.  Always use your most recent med list.               ADVAIR DISKUS 100-50 MCG/DOSE Aepb  Generic drug:  Fluticasone-Salmeterol  Inhale 1 puff into the lungs 2 (two) times daily. Reported on  03/25/2015     ADVAIR HFA 115-21 MCG/ACT inhaler  Generic drug:  fluticasone-salmeterol  Inhale 2 puffs into the lungs 2 (two) times daily.     albuterol 108 (90 Base) MCG/ACT inhaler  Commonly known as:  PROAIR HFA  TWO PUFFS EVERY 4-6 HOURS AS NEEDED FOR COUGH OR WHEEZE.     fluticasone 50 MCG/ACT nasal spray  Commonly known as:  FLONASE  Place 1 spray into both nostrils 2  (two) times daily as needed for allergies or rhinitis.     ibuprofen 800 MG tablet  Commonly known as:  ADVIL,MOTRIN  Take 1 tablet (800 mg total) by mouth 3 (three) times daily.     loratadine 10 MG tablet  Commonly known as:  CLARITIN  Take 10 mg by mouth daily.     methocarbamol 500 MG tablet  Commonly known as:  ROBAXIN  Take 2 tablets (1,000 mg total) by mouth 2 (two) times daily.     oxyCODONE-acetaminophen 5-325 MG tablet  Commonly known as:  PERCOCET/ROXICET  Take 1-2 tablets by mouth every 4 (four) hours as needed for severe pain.        Known medication allergies: Allergies  Allergen Reactions  . Other     MANGO CAUSES LIP SWELLING  . Penicillins Other (See Comments)    pneumonia    Review of systems: Constitutional: Negative for fever, chills and weight loss.  HENT: Negative for nosebleeds.  Positive for nasal congestion and postnasal drainage. Eyes: Negative for blurred vision.  Respiratory: Negative for hemoptysis.  Positive for coughing, dyspnea, and wheezing. Cardiovascular: Negative for chest pain.  Gastrointestinal: Negative for diarrhea and constipation.  Genitourinary: Negative for dysuria.  Musculoskeletal: Negative for myalgias and joint pain.  Neurological: Negative for dizziness.  Endo/Heme/Allergies: Does not bruise/bleed easily.   Past Medical History  Diagnosis Date  . Perennial allergic rhinitis     ragweed, dogs, cats, mold  . Asthma   . GERD (gastroesophageal reflux disease)     tums  . History of chicken pox     [age]yrs old    Family History  Problem Relation Age of Onset  . Cancer Mother [id number]    breast, bone  . Hyperlipidemia Father   . Hypertension Father   . Diabetes Father   . Diabetes Paternal Uncle   . Hyperlipidemia Paternal Grandfather   . Hypertension Paternal Grandfather     Social History   Social History  . Marital Status: Married    Spouse Name: N/A  . Number of Children: N/A  . Years of Education: N/A    Occupational History  . Not on file.   Social History Main Topics  . Smoking status: Former Smoker    Quit date: 07/04/2008  . Smokeless tobacco: Never Used  . Alcohol Use: Yes     Comment: once a week  . Drug Use: No  . Sexual Activity: Not on file   Other Topics Concern  . Not on file   Social History Narrative   Lives with wife and daughter (2013), 1 dog   Occupation: Therapist, musicunloader for [employer]   Edu: HS   Activity - yardwork   Diet - good water, gatorade 16 oz at work, fruits/vegetables daily    I appreciate the opportunity to take part in this Gergory's care. Please do not hesitate to contact me with questions.  Sincerely,   R. Jorene Guestarter Weslie Pretlow, MD

## 2015-03-25 NOTE — Patient Instructions (Signed)
Asthma with acute exacerbation  Prednisone has been provided, 40 mg x3 days, 20 mg x1 day, 10 mg x1 day, then stop.  Continue Advair HFA 115/21 g, 2 inhalations twice a day.  To maximize pulmonary deposition, a spacer has been provided along with instructions for its proper administration with an HFA inhaler.  Continue albuterol HFA every 4-6 hours as needed.  The patient has been asked to contact me if his symptoms persist or progress. Otherwise, he may return for follow up in 4 months.  Perennial allergic rhinitis  Prednisone has been provided (as above).  A prescription has been provided for fluticasone nasal spray, 2 sprays per nostril daily as needed. Proper nasal spray technique has been discussed and demonstrated.  Nasal saline lavage (NeilMed) as needed has been recommended along with instructions for proper administration.  Continue allergen avoidance measures.   Return in about 4 months (around 07/24/2015), or if symptoms worsen or fail to improve.

## 2015-04-14 ENCOUNTER — Ambulatory Visit: Payer: Self-pay | Admitting: Allergy and Immunology

## 2015-04-16 ENCOUNTER — Other Ambulatory Visit: Payer: Self-pay | Admitting: Neurology

## 2015-04-16 MED ORDER — FLUTICASONE-SALMETEROL 115-21 MCG/ACT IN AERO: 2.0000 | INHALATION_SPRAY | Freq: Two times a day (BID) | RESPIRATORY_TRACT | Status: DC

## 2015-04-16 MED ORDER — ALBUTEROL SULFATE HFA 108 (90 BASE) MCG/ACT IN AERS: 2.0000 | INHALATION_SPRAY | RESPIRATORY_TRACT | Status: DC | PRN

## 2015-10-05 ENCOUNTER — Ambulatory Visit (INDEPENDENT_AMBULATORY_CARE_PROVIDER_SITE_OTHER): Payer: Managed Care, Other (non HMO) | Admitting: Family Medicine

## 2015-10-05 ENCOUNTER — Encounter: Payer: Self-pay | Admitting: Family Medicine

## 2015-10-05 VITALS — BP 122/84 | HR 88 | Temp 98.6°F | Wt 262.5 lb

## 2015-10-05 DIAGNOSIS — J4530 Mild persistent asthma, uncomplicated: Secondary | ICD-10-CM

## 2015-10-05 MED ORDER — PREDNISONE 20 MG PO TABS: ORAL_TABLET | ORAL | Status: DC

## 2015-10-05 NOTE — Patient Instructions (Addendum)
Take the pred taper with food.  Call the allergy clinic- you may need another long term preventive med during spring and summer.  Restart claritin.  Take care.  Glad to see you.

## 2015-10-05 NOTE — Progress Notes (Signed)
Pre visit review using our clinic review tool, if applicable. No additional management support is needed unless otherwise documented below in the visit note.  H/o asthma, with ER visits but not overnight admission.    Has seen allergy and asthma clinic prev.  Still on baseline advair, 2 puffs twice a day, rinsing after use.   Not smoking.   Using albuterol twice in the last few days, not usually used o/w.  That was the first time used in the last few months.    He has all of his meds to use.  Not wheezing but chest feels tight and feels the need to take a deep breath.  Used SABA last about 1 hour ago.    No FCNAVD.  Air quality is worse recently, has been working outside.  Works in a [employer], hot environment.    No cough, no sputum.  He was recently exposed to a cat and that is a trigger for him.  Last used claritin last week.  No recent pred use.    Meds, vitals, and allergies reviewed.   ROS: Per HPI unless specifically indicated in ROS section   GEN: nad, alert and oriented HEENT: mucous membranes moist, OP wnl, allergic shiners noted.  NECK: supple w/o LA CV: rrr. PULM: ctab, no inc wob, no wheeze.  ABD: soft, +bs EXT: no edema

## 2015-10-06 NOTE — Assessment & Plan Note (Signed)
D/w pt:  Take the pred taper with food.  Routine cautions given.  He'll call the allergy clinic- he may need another long term preventive med during spring and summer.  Restart claritin.  He agrees.

## 2015-10-15 ENCOUNTER — Encounter: Payer: Self-pay | Admitting: *Deleted

## 2015-10-15 ENCOUNTER — Ambulatory Visit (INDEPENDENT_AMBULATORY_CARE_PROVIDER_SITE_OTHER): Payer: Managed Care, Other (non HMO) | Admitting: Family

## 2015-10-15 ENCOUNTER — Encounter: Payer: Self-pay | Admitting: Family

## 2015-10-15 VITALS — BP 128/88 | HR 83 | Temp 98.4°F | Wt 262.8 lb

## 2015-10-15 DIAGNOSIS — S335XXA Sprain of ligaments of lumbar spine, initial encounter: Secondary | ICD-10-CM

## 2015-10-15 DIAGNOSIS — S339XXA Sprain of unspecified parts of lumbar spine and pelvis, initial encounter: Secondary | ICD-10-CM

## 2015-10-15 MED ORDER — CYCLOBENZAPRINE HCL 10 MG PO TABS: 10.0000 mg | ORAL_TABLET | Freq: Every evening | ORAL | Status: DC | PRN

## 2015-10-15 NOTE — Patient Instructions (Signed)
Work note provided. As we discussed, please do not take muscle relaxant when you are work. This is for bedtime use only. Moist, warm heat for low back. Please let us know if not any better.  If there is no improvement in your symptoms, or if there is any worsening of symptoms, or if you have any additional concerns, please return for re-evaluation; or, if we are closed, consider going to the Emergency Room for evaluation if symptoms urgent.

## 2015-10-15 NOTE — Progress Notes (Signed)
Subjective:    Patient ID: Shawn Davenport, male    DOB: 01/23/1981, 35 y.o.   MRN: [id number]   Shawn Davenport is a 35 y.o. male who presents today for an acute visit.    HPI Comments: Patient here for evaluation of low back pain after injury yesterday while doing yardwork. Ibuprofen with some relief. Pain worse with bending, twisting.  No fever, chills, dysuria. Similar instance a year ago where he went to the ED; muscle relaxants worked well. 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  Past Medical History  Diagnosis Date  . Perennial allergic rhinitis     ragweed, dogs, cats, mold  . Asthma   . GERD (gastroesophageal reflux disease)     tums  . History of chicken pox     35yrs old   Allergies: Other and Penicillins Current Outpatient Prescriptions on File Prior to Visit  Medication Sig Dispense Refill  . albuterol (PROAIR HFA) 108 (90 Base) MCG/ACT inhaler Inhale 2 puffs into the lungs every 4 (four) hours as needed for wheezing or shortness of breath. 1 Inhaler 0  . fluticasone (FLONASE) 50 MCG/ACT nasal spray Place 1 spray into both nostrils 2 (two) times daily as needed for allergies or rhinitis. 16 g 3  . fluticasone-salmeterol (ADVAIR HFA) 115-21 MCG/ACT inhaler Inhale 2 puffs into the lungs 2 (two) times daily. 1 Inhaler 3  . ibuprofen (ADVIL,MOTRIN) 800 MG tablet Take 1 tablet (800 mg total) by mouth 3 (three) times daily. 21 tablet 0  . loratadine (CLARITIN) 10 MG tablet Take 10 mg by mouth daily.    . predniSONE (DELTASONE) 20 MG tablet 1 a day for 4 days then 1/2 a day for 4 day with food. 6 tablet 0   No current facility-administered medications on file prior to visit.    Social History  Substance Use Topics  . Smoking status: Former Smoker    Quit date: 07/04/2008  . Smokeless tobacco: Never Used  . Alcohol Use: 0.0 oz/week    0 Standard drinks or equivalent per week     Comment: once a week    Review of Systems  Constitutional: Negative  for fever and chills.  Respiratory: Negative for cough.   Cardiovascular: Negative for chest pain and palpitations.  Gastrointestinal: Negative for nausea and vomiting.  Genitourinary: Negative for dysuria, frequency and flank pain.  Musculoskeletal: Positive for back pain.      Objective:    BP 128/88 mmHg  Pulse 83  Temp(Src) 98.4 F (36.9 C) (Oral)  Wt 262 lb 12 oz (119.183 kg)  SpO2 98%   Physical Exam  Constitutional: He appears well-developed and well-nourished.  Cardiovascular: Regular rhythm and normal heart sounds.   Pulmonary/Chest: Effort normal and breath sounds normal. No respiratory distress. He has no wheezes. He has no rhonchi. He has no rales.  Musculoskeletal:       Lumbar back: He exhibits pain and spasm. He exhibits normal range of motion, no tenderness and no swelling.       Back:  Pain as described by patient marked on diagram. No marked tenderness with palpation. Full range of motion with flexion, extension, lateral side bends, however elicits pain. No pain, numbness, tingling elicited with single leg raise bilaterally. No rash.  Lymphadenopathy:       Head (left side): No submandibular and no preauricular adenopathy present.  Neurological: He is alert. He has normal strength.  Reflex Scores:      Patellar reflexes  are 2+ on the right side and 2+ on the left side. Sensation and strength intact BLE.  Skin: Skin is warm and dry.  Psychiatric: He has a normal mood and affect. His speech is normal and behavior is normal.  Vitals reviewed.      Assessment & Plan:   1. Low back sprain, initial encounter Low back sprain after injury. Short course of muscle relaxant. Work note provided. Advised patient to not take muscle relaxant with alcohol or take while at work as he uses machinery at [employer].  - cyclobenzaprine (FLEXERIL) 10 MG tablet; Take 1 tablet (10 mg total) by mouth at bedtime as needed for muscle spasms.  Dispense: 14 tablet; Refill:  0    I am having Shawn Davenport maintain his loratadine, ibuprofen, fluticasone, fluticasone-salmeterol, albuterol, and predniSONE.   No orders of the defined types were placed in this encounter.    Return precautions given.   Risks, benefits, and alternatives of the medications and treatment plan prescribed today were discussed, and patient expressed understanding.   Education regarding symptom management and diagnosis given to patient on AVS.  Continue to follow with Eustaquio BoydenJavier Gutierrez, MD for routine health maintenance.   Burnadette Popavid M Deschamps and I agreed with plan.   Rennie PlowmanMargaret Adraine Biffle, FNP

## 2015-10-15 NOTE — Progress Notes (Signed)
Pre visit review using our clinic review tool, if applicable. No additional management support is needed unless otherwise documented below in the visit note. 

## 2015-11-03 ENCOUNTER — Encounter: Payer: Self-pay | Admitting: Primary Care

## 2015-11-03 ENCOUNTER — Ambulatory Visit (INDEPENDENT_AMBULATORY_CARE_PROVIDER_SITE_OTHER): Payer: Managed Care, Other (non HMO) | Admitting: Primary Care

## 2015-11-03 VITALS — BP 154/82 | HR 123 | Temp 101.5°F | Ht 68.75 in | Wt 261.1 lb

## 2015-11-03 DIAGNOSIS — J029 Acute pharyngitis, unspecified: Secondary | ICD-10-CM | POA: Diagnosis not present

## 2015-11-03 LAB — POCT RAPID STREP A (OFFICE): Rapid Strep A Screen: POSITIVE — AB

## 2015-11-03 MED ORDER — AZITHROMYCIN 250 MG PO TABS: ORAL_TABLET | ORAL | 0 refills | Status: DC

## 2015-11-03 MED ORDER — METHYLPREDNISOLONE ACETATE 80 MG/ML IJ SUSP
80.0000 mg | Freq: Once | INTRAMUSCULAR | Status: AC
  Administered 2015-11-03: 80 mg via INTRAMUSCULAR

## 2015-11-03 NOTE — Progress Notes (Signed)
Pre visit review using our clinic review tool, if applicable. No additional management support is needed unless otherwise documented below in the visit note. 

## 2015-11-03 NOTE — Progress Notes (Addendum)
Subjective:    Patient ID: Shawn Davenport, male    DOB: 15-Sep-1980, 35 y.o.   MRN: [id number]  HPI  Shawn Davenport is a 35 year old male who presents today with a chief complaint of sore throat. He also reports fevers, chills, body aches, nasal congestion. Denies nausea, cough, ear discomfort. His symptoms began Sunday this week. He's taken ibuprofen Sunday with some improvement, but has not had anything OTC since Sunday. Overall he's feeling worse. He's having difficulty swallowing due to discomfort and swelling.  Review of Systems  Constitutional: Positive for chills, fatigue and fever.  HENT: Positive for congestion, sore throat and trouble swallowing.   Respiratory: Negative for cough.   Gastrointestinal: Negative for nausea.       Past Medical History:  Diagnosis Date  . Asthma   . GERD (gastroesophageal reflux disease)    tums  . History of chicken pox    [age]yrs old  . Perennial allergic rhinitis    ragweed, dogs, cats, mold     Social History   Social History  . Marital status: Married    Spouse name: N/A  . Number of children: N/A  . Years of education: N/A   Occupational History  . Not on file.   Social History Main Topics  . Smoking status: Former Smoker    Quit date: 07/04/2008  . Smokeless tobacco: Never Used  . Alcohol use 0.0 oz/week     Comment: once a week  . Drug use: No  . Sexual activity: Not Currently   Other Topics Concern  . Not on file   Social History Narrative   Lives with wife and daughter (2013), 1 dog   Occupation: Therapist, musicunloader for [employer]   Edu: HS   Activity - yardwork   Diet - good water, gatorade 16 oz at work, fruits/vegetables daily    Past Surgical History:  Procedure Laterality Date  . FOOT SURGERY Right   . HAND SURGERY Left 2013   pins for broken 5th MCP  . WRIST SURGERY Left 1999    Family History  Problem Relation Age of Onset  . Cancer Mother 4035    breast, bone  . Hyperlipidemia Father   . Hypertension  Father   . Diabetes Father   . Diabetes Paternal Uncle   . Hyperlipidemia Paternal Grandfather   . Hypertension Paternal Grandfather     Allergies  Allergen Reactions  . Other     MANGO CAUSES LIP SWELLING  . Penicillins Other (See Comments)    pneumonia     Current Outpatient Prescriptions on File Prior to Visit  Medication Sig Dispense Refill  . albuterol (PROAIR HFA) 108 (90 Base) MCG/ACT inhaler Inhale 2 puffs into the lungs every 4 (four) hours as needed for wheezing or shortness of breath. 1 Inhaler 0  . fluticasone (FLONASE) 50 MCG/ACT nasal spray Place 1 spray into both nostrils 2 (two) times daily as needed for allergies or rhinitis. 16 g 3  . fluticasone-salmeterol (ADVAIR HFA) 115-21 MCG/ACT inhaler Inhale 2 puffs into the lungs 2 (two) times daily. 1 Inhaler 3  . ibuprofen (ADVIL,MOTRIN) 800 MG tablet Take 1 tablet (800 mg total) by mouth 3 (three) times daily. 21 tablet 0  . loratadine (CLARITIN) 10 MG tablet Take 10 mg by mouth daily.     No current facility-administered medications on file prior to visit.     BP (!) 154/82   Pulse (!) 123   Temp (!) 101.5 F (38.6  C) (Oral)   Ht 5' 8.75" (1.746 m)   Wt 261 lb 1.9 oz (118.4 kg)   SpO2 97%   BMI 38.84 kg/m    Objective:   Physical Exam  Constitutional: He appears well-nourished. He appears ill.  HENT:  Right Ear: Tympanic membrane and ear canal normal.  Left Ear: Tympanic membrane and ear canal normal.  Nose: No mucosal edema. Right sinus exhibits no maxillary sinus tenderness and no frontal sinus tenderness. Left sinus exhibits no maxillary sinus tenderness and no frontal sinus tenderness.  Mouth/Throat: Oropharyngeal exudate, posterior oropharyngeal edema and posterior oropharyngeal erythema present.  Eyes: Conjunctivae are normal.  Neck: Neck supple.  Cardiovascular: Normal rate and regular rhythm.   Pulmonary/Chest: Effort normal and breath sounds normal. He has no wheezes. He has no rales.  Skin:  Skin is warm and dry.          Assessment & Plan:  Sore Throat:  Present since Sunday this week. Also with chills, fever of 101.5 in the office today, fatigue. Some improvement with ibuprofen that he's only taken once. Exam today with moderate erythema and edema. No exudate. Rapid Strep: positive IM depo medrol 80 provided in office today for swelling. Rx for Zpak sent to pharmacy, PCN allergy.  Warm salt gargles, fluids, rest. Follow up PRN.  Morrie Sheldon, NP

## 2015-11-03 NOTE — Addendum Note (Signed)
Addended by: Tawnya CrookSAMBATH, Henricks Paulson on: 11/03/2015 04:13 PM   Modules accepted: Orders

## 2015-11-03 NOTE — Patient Instructions (Signed)
Start Azithromycin antibiotics. Take 2 tablets by mouth today, then 1 tablet daily for 4 additional days.  You were also provided with an injection of steroids to help with inflammation/swelling to your throat.  Start ibuprofen 600 mg three times daily for fevers, pain, inflammation.   Please call me if no improvement in 3-4 days.  It was a pleasure meeting you!

## 2015-11-03 NOTE — Addendum Note (Signed)
Addended by: Tawnya CrookSAMBATH, Jenascia Bumpass on: 11/03/2015 04:00 PM   Modules accepted: Orders

## 2015-11-06 ENCOUNTER — Telehealth: Payer: Self-pay

## 2015-11-06 NOTE — Telephone Encounter (Signed)
Recommend re eval if no better. [facility]. If doesn't want tonight, would offer sat clinic - ensure fever has improved - if not rec eval tonight.  May be developing suppurative complication of strep pharyngitis.

## 2015-11-06 NOTE — Telephone Encounter (Signed)
Patient notified and appt scheduled for Saturday clinic. 

## 2015-11-06 NOTE — Telephone Encounter (Signed)
Pt left v/m ; pt was seen 11/03/15 for S/T and pt is no better; pt throat is still so sore can hardly swallow; pt not eating or drinking a lot since hurts so bad to swallow. Pt request cb with what to do next. H/T [place]. Mayra ReelKate Clark NP out of office; sending note to Dr Reece AgarG.

## 2015-11-07 ENCOUNTER — Encounter: Payer: Self-pay | Admitting: Internal Medicine

## 2015-11-07 ENCOUNTER — Ambulatory Visit (INDEPENDENT_AMBULATORY_CARE_PROVIDER_SITE_OTHER): Payer: Managed Care, Other (non HMO) | Admitting: Internal Medicine

## 2015-11-07 DIAGNOSIS — J020 Streptococcal pharyngitis: Secondary | ICD-10-CM | POA: Diagnosis not present

## 2015-11-07 MED ORDER — CLINDAMYCIN HCL 300 MG PO CAPS: 300.0000 mg | ORAL_CAPSULE | Freq: Three times a day (TID) | ORAL | 0 refills | Status: DC

## 2015-11-07 NOTE — Patient Instructions (Signed)
Please go to the ER if you have any trouble breathing or can't swallow at all. Please take a probiotic while you are on the new antibiotic

## 2015-11-07 NOTE — Progress Notes (Signed)
Subjective:    Patient ID: Shawn Davenport, male    DOB: [birth year], 35 y.o.   MRN: [id number]  HPI Here due to persistent throat symptoms Wife is here  Reviewed last notes Finished z-pak Has really bad sore throat Pain in left ear--popping and pain (was occurring before also) Fever gone in past 2 days Hungry but severe pain to swallow Had sweats and chills at first--also better in past 2 days  Not much cough At most mild SOB--has been using inhaler some  Now having bad acid reflux  Current Outpatient Prescriptions on File Prior to Visit  Medication Sig Dispense Refill  . albuterol (PROAIR HFA) 108 (90 Base) MCG/ACT inhaler Inhale 2 puffs into the lungs every 4 (four) hours as needed for wheezing or shortness of breath. 1 Inhaler 0  . fluticasone (FLONASE) 50 MCG/ACT nasal spray Place 1 spray into both nostrils 2 (two) times daily as needed for allergies or rhinitis. 16 g 3  . fluticasone-salmeterol (ADVAIR HFA) 115-21 MCG/ACT inhaler Inhale 2 puffs into the lungs 2 (two) times daily. 1 Inhaler 3  . ibuprofen (ADVIL,MOTRIN) 800 MG tablet Take 1 tablet (800 mg total) by mouth 3 (three) times daily. 21 tablet 0  . loratadine (CLARITIN) 10 MG tablet Take 10 mg by mouth daily.     No current facility-administered medications on file prior to visit.     Allergies  Allergen Reactions  . Other     MANGO CAUSES LIP SWELLING  . Penicillins Other (See Comments)    pneumonia     Past Medical History:  Diagnosis Date  . Asthma   . GERD (gastroesophageal reflux disease)    tums  . History of chicken pox    [age]yrs old  . Perennial allergic rhinitis    ragweed, dogs, cats, mold    Past Surgical History:  Procedure Laterality Date  . FOOT SURGERY Right   . HAND SURGERY Left 2013   pins for broken 5th MCP  . WRIST SURGERY Left 1999    Family History  Problem Relation Age of Onset  . Cancer Mother 6835    breast, bone  . Hyperlipidemia Father   . Hypertension Father   .  Diabetes Father   . Diabetes Paternal Uncle   . Hyperlipidemia Paternal Grandfather   . Hypertension Paternal Grandfather     Social History   Social History  . Marital status: Married    Spouse name: N/A  . Number of children: N/A  . Years of education: N/A   Occupational History  . Not on file.   Social History Main Topics  . Smoking status: Former Smoker    Quit date: 07/04/2008  . Smokeless tobacco: Never Used  . Alcohol use 0.0 oz/week     Comment: once a week  . Drug use: No  . Sexual activity: Not Currently   Other Topics Concern  . 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 No distress.  HENT:  Tonsils 2+ and uvula inflamed and red. No exudate No evidence of abscess TMs normal  Neck: Normal range of  motion. Neck supple.  Moderate bilateral adenopathy  Pulmonary/Chest: Effort normal and breath sounds normal. No respiratory distress. He has no wheezes. He has no rales.          Assessment & Plan:

## 2015-11-07 NOTE — Assessment & Plan Note (Signed)
Still has fairly classic presentation for this Seems to be an antibiotic failure No apparent abscess or other complication Will change to clinda--use probiotic

## 2015-11-09 ENCOUNTER — Emergency Department (HOSPITAL_COMMUNITY): Payer: Managed Care, Other (non HMO)

## 2015-11-09 ENCOUNTER — Emergency Department (HOSPITAL_COMMUNITY)
Admission: EM | Admit: 2015-11-09 | Discharge: 2015-11-10 | Disposition: A | Discharge status: Home / Self Care | Payer: Managed Care, Other (non HMO) | Attending: Emergency Medicine | Admitting: Emergency Medicine

## 2015-11-09 DIAGNOSIS — Z87891 Personal history of nicotine dependence: Secondary | ICD-10-CM | POA: Insufficient documentation

## 2015-11-09 DIAGNOSIS — J45901 Unspecified asthma with (acute) exacerbation: Secondary | ICD-10-CM | POA: Diagnosis not present

## 2015-11-09 DIAGNOSIS — R0789 Other chest pain: Secondary | ICD-10-CM | POA: Insufficient documentation

## 2015-11-09 DIAGNOSIS — R079 Chest pain, unspecified: Secondary | ICD-10-CM | POA: Diagnosis present

## 2015-11-09 DIAGNOSIS — J029 Acute pharyngitis, unspecified: Secondary | ICD-10-CM | POA: Insufficient documentation

## 2015-11-09 LAB — BASIC METABOLIC PANEL
ANION GAP: 14 (ref 5–15)
BUN: 11 mg/dL (ref 6–20)
CALCIUM: 9.8 mg/dL (ref 8.9–10.3)
CO2: 23 mmol/L (ref 22–32)
Chloride: 98 mmol/L — ABNORMAL LOW (ref 101–111)
Creatinine, Ser: 0.91 mg/dL (ref 0.61–1.24)
GFR calc Af Amer: >60 mL/min (ref >60)
GFR calc non Af Amer: >60 mL/min (ref >60)
GLUCOSE: 105 mg/dL — AB (ref 65–99)
Potassium: 4 mmol/L (ref 3.5–5.1)
Sodium: 135 mmol/L (ref 135–145)

## 2015-11-09 LAB — CBC
HEMATOCRIT: 47.1 % (ref 39.0–52.0)
HEMOGLOBIN: 16.1 g/dL (ref 13.0–17.0)
MCH: 29.7 pg (ref 26.0–34.0)
MCHC: 34.2 g/dL (ref 30.0–36.0)
MCV: 86.9 fL (ref 78.0–100.0)
Platelets: 298 10*3/uL (ref 150–400)
RBC: 5.42 MIL/uL (ref 4.22–5.81)
RDW: 12.7 % (ref 11.5–15.5)
WBC: 12 10*3/uL — ABNORMAL HIGH (ref 4.0–10.5)

## 2015-11-09 LAB — I-STAT TROPONIN, ED
TROPONIN I, POC: 0 ng/mL (ref 0.00–0.08)
TROPONIN I, POC: 0 ng/mL (ref 0.00–0.08)

## 2015-11-09 MED ORDER — HYDROCODONE-ACETAMINOPHEN 5-325 MG PO TABS
2.0000 | ORAL_TABLET | Freq: Once | ORAL | Status: AC
  Administered 2015-11-09: 2 via ORAL
  Filled 2015-11-09: qty 2

## 2015-11-09 NOTE — ED Provider Notes (Signed)
[facility]-EMERGENCY DEPT Provider Note   CSN: [id number] Arrival date & time: 11/09/15  1859     History   Chief Complaint Chief Complaint  Patient presents with  . Chest Pain    HPI Shawn Davenport is a 35 y.o. male.  HPI Complains of of sore throat onset 8 days ago. Treated with antibiotic. Pain is worse with swallowing. He also reports mild cough. He was diagnosed with strep throat last week. He reports positive strep test. He presents tonight as he developed chest pain over the past 2 days which is intermittent lasting 2 or 3 minutes at a time caused by eating at anterior chest nonradiating pain is nonexertional worse with eating or drinking or swallowing some made worse with lying supine, improved with sitting up. no other associated symptoms. He's been treated with ibuprofen with relief of throat pain. No other associated symptoms. No shortness of breath nausea or sweatiness Past Medical History:  Diagnosis Date  . Asthma   . GERD (gastroesophageal reflux disease)    tums  . History of chicken pox    [age]yrs old  . Perennial allergic rhinitis    ragweed, dogs, cats, mold    Patient Active Problem List   Diagnosis Date Noted  . Streptococcal sore throat 11/07/2015  . Asthma with acute exacerbation 03/25/2015  . Elevated blood pressure 07/04/2013  . Obesity 07/04/2013  . Perennial allergic rhinitis   . Asthma     Past Surgical History:  Procedure Laterality Date  . FOOT SURGERY Right   . HAND SURGERY Left 2013   pins for broken 5th MCP  . WRIST SURGERY Left 1999       Home Medications    Prior to Admission medications   Medication Sig Start Date End Date Taking? Authorizing Provider  albuterol (PROAIR HFA) 108 (90 Base) MCG/ACT inhaler Inhale 2 puffs into the lungs every 4 (four) hours as needed for wheezing or shortness of breath. 04/16/15   Cristal Fordalph Carter Bobbitt, MD  clindamycin (CLEOCIN) 300 MG capsule Take 1 capsule (300 mg total) by mouth 3 (three) times  daily. 11/07/15   Karie Schwalbeichard I Letvak, MD  fluticasone (FLONASE) 50 MCG/ACT nasal spray Place 1 spray into both nostrils 2 (two) times daily as needed for allergies or rhinitis. 03/25/15   Cristal Fordalph Carter Bobbitt, MD  fluticasone-salmeterol (ADVAIR HFA) [phone] MCG/ACT inhaler Inhale 2 puffs into the lungs 2 (two) times daily. 04/16/15   Cristal Fordalph Carter Bobbitt, MD  ibuprofen (ADVIL,MOTRIN) 800 MG tablet Take 1 tablet (800 mg total) by mouth 3 (three) times daily. 12/02/14   Elpidio AnisShari Upstill, PA-C  loratadine (CLARITIN) 10 MG tablet Take 10 mg by mouth daily.    Historical Provider, MD    Family History Family History  Problem Relation Age of Onset  . Cancer Mother 6935    breast, bone  . Hyperlipidemia Father   . Hypertension Father   . Diabetes Father   . Diabetes Paternal Uncle   . Hyperlipidemia Paternal Grandfather   . Hypertension Paternal Grandfather     Social History Social History  Substance Use Topics  . Smoking status: Former Smoker    Quit date: 07/04/2008  . Smokeless tobacco: Never Used  . Alcohol use 0.0 oz/week     Comment: once a week     Allergies   Other and Penicillins   Review of Systems Review of Systems  Constitutional: Negative.   HENT: Positive for sore throat.   Respiratory: Positive for cough.   Cardiovascular:  Positive for chest pain.       Syncope  Gastrointestinal: Negative.   Musculoskeletal: Negative.   Skin: Negative.   Allergic/Immunologic: Negative.   Neurological: Negative.   Psychiatric/Behavioral: Negative.   All other systems reviewed and are negative.    Physical Exam Updated Vital Signs BP 128/84   Pulse 110   Temp 98.8 F (37.1 C) (Oral)   Resp 22   SpO2 98%   Physical Exam  Constitutional: He appears well-developed and well-nourished.  HENT:  Head: Normocephalic and atraumatic.  Mouth/Throat: Oropharynx is clear and moist. No oropharyngeal exudate.  Oropharynx reddened tonsils large uvula midline  Eyes: Conjunctivae are normal.  Pupils are equal, round, and reactive to light.  Neck: Neck supple. No tracheal deviation present. No thyromegaly present.  Cardiovascular: Normal rate, regular rhythm, normal heart sounds and intact distal pulses.   No murmur heard. Pulmonary/Chest: Effort normal and breath sounds normal.  Abdominal: Soft. Bowel sounds are normal. He exhibits no distension. There is no tenderness.  Obese  Musculoskeletal: Normal range of motion. He exhibits no edema or tenderness.  Neurological: He is alert. Coordination normal.  Skin: Skin is warm and dry. No rash noted.  Psychiatric: He has a normal mood and affect.  Nursing note and vitals reviewed.    ED Treatments / Results  Labs (all labs ordered are listed, but only abnormal results are displayed) Labs Reviewed  BASIC METABOLIC PANEL - Abnormal; Notable for the following:       Result Value   Chloride 98 (*)    Glucose, Bld 105 (*)    All other components within normal limits  CBC - Abnormal; Notable for the following:    WBC 12.0 (*)    All other components within normal limits  I-STAT TROPOININ, ED  I-STAT TROPOININ, ED    EKG  EKG Interpretation  Date/Time:  Monday November 09 2015 19:08:25 EDT Ventricular Rate:  114 PR Interval:  138 QRS Duration: 82 QT Interval:  318 QTC Calculation: 438 R Axis:   28 Text Interpretation:  Sinus tachycardia Otherwise normal ECG No old tracing to compare Confirmed by Ethelda Chick  MD, Dontavius Keim [phone]) on 11/09/2015 10:45:52 PM       Radiology Dg Chest 2 View  Result Date: 11/09/2015 CLINICAL DATA:  Chest pain and shortness of breath. EXAM: CHEST  2 VIEW COMPARISON:  Two-view chest x-ray 11/22/2010 FINDINGS: The heart size and mediastinal contours are within normal limits. Both lungs are clear. The visualized skeletal structures are unremarkable. IMPRESSION: Negative two view chest x-ray. Electronically Signed   By: Marin Roberts M.D.   On: 11/09/2015 21:08    Procedures Procedures  (including critical care time)  Medications Ordered in ED Medications  HYDROcodone-acetaminophen (NORCO/VICODIN) 5-325 MG per tablet 2 tablet (2 tablets Oral Given 11/09/15 2312)   Chest x-ray viewed by me Results for orders placed or performed during the hospital encounter of 11/09/15  Basic metabolic panel  Result Value Ref Range   Sodium 135 135 - 145 mmol/L   Potassium 4.0 3.5 - 5.1 mmol/L   Chloride 98 (L) 101 - 111 mmol/L   CO2 23 22 - 32 mmol/L   Glucose, Bld 105 (H) 65 - 99 mg/dL   BUN 11 6 - 20 mg/dL   Creatinine, Ser 6.04 0.61 - 1.24 mg/dL   Calcium 9.8 8.9 - 54.0 mg/dL   GFR calc non Af Amer >60 >60 mL/min   GFR calc Af Amer >60 >60 mL/min   Anion gap  14 5 - 15  CBC  Result Value Ref Range   WBC 12.0 (H) 4.0 - 10.5 K/uL   RBC 5.42 4.22 - 5.81 MIL/uL   Hemoglobin 16.1 13.0 - 17.0 g/dL   HCT 16.147.1 09.639.0 - 04.552.0 %   MCV 86.9 78.0 - 100.0 fL   MCH 29.7 26.0 - 34.0 pg   MCHC 34.2 30.0 - 36.0 g/dL   RDW 40.912.7 81.111.5 - 91.415.5 %   Platelets 298 150 - 400 K/uL  I-stat troponin, ED  Result Value Ref Range   Troponin i, poc 0.00 0.00 - 0.08 ng/mL   Comment 3          I-Stat Troponin, ED (not at [facility])  Result Value Ref Range   Troponin i, poc 0.00 0.00 - 0.08 ng/mL   Comment 3           Dg Chest 2 View  Result Date: 11/09/2015 CLINICAL DATA:  Chest pain and shortness of breath. EXAM: CHEST  2 VIEW COMPARISON:  Two-view chest x-ray 11/22/2010 FINDINGS: The heart size and mediastinal contours are within normal limits. Both lungs are clear. The visualized skeletal structures are unremarkable. IMPRESSION: Negative two view chest x-ray. Electronically Signed   By: Marin Robertshristopher  Mattern M.D.   On: 11/09/2015 21:08    Initial Impression / Assessment and Plan / ED Course  I have reviewed the triage vital signs and the nursing notes.  Pertinent labs & imaging results that were available during my care of the patient were reviewed by me and considered in my medical decision making (see  chart for details).  Clinical Course   11:50 PM sore throat pain improved after treatment with Norco.. He has no chest pain. Heart score equals 2, Based on risk factors and EKG. Cardiac risk factors include family history of hypertension. Symptoms likely consistent with esophagitis, worse with eating and lying supine. Final Clinical Impressions(s) / ED Diagnoses  Plan prescription Norco. Prilosec OTC. Follow up with his PMD Dr. Sharen HonesGutierrez if not improved in a week Final diagnoses:  None    #1 atypical chest pain #2 pharyngitis New Prescriptions New Prescriptions   No medications on file     Doug SouSam Kinga Cassar, MD 11/10/15 0003

## 2015-11-09 NOTE — ED Triage Notes (Addendum)
CP starting yesterday. Has had strep throat and is on second antibiotic for that. Has felt nauseated, no vomiting, for a week also. Was starting to feel better until intermittent CP started. CP came back today. Currently denies chest pain. Last episode of CP at 1745.

## 2015-11-09 NOTE — ED Notes (Signed)
EDP at bedside  

## 2015-11-10 ENCOUNTER — Ambulatory Visit: Payer: Self-pay | Admitting: Internal Medicine

## 2015-11-10 MED ORDER — HYDROCODONE-ACETAMINOPHEN 5-325 MG PO TABS: 1.0000 | ORAL_TABLET | Freq: Four times a day (QID) | ORAL | 0 refills | Status: DC | PRN

## 2015-11-10 NOTE — Discharge Instructions (Signed)
Stop taking ibuprofen.  Sleep with your head upright on 2 pillows. Take Prilosec OTC for chest discomfort. Take Tylenol for mild pain or the pain medicine prescribed for bad pain. Don't take Tylenol together with the pain medicine prescribed as the combination can be dangerous to your liver. Follow-up with Dr. Sharen HonesGutierrez if you're not feeling better in a week.

## 2015-11-13 ENCOUNTER — Encounter: Payer: Self-pay | Admitting: Family Medicine

## 2015-11-13 ENCOUNTER — Ambulatory Visit (INDEPENDENT_AMBULATORY_CARE_PROVIDER_SITE_OTHER): Payer: Managed Care, Other (non HMO) | Admitting: Family Medicine

## 2015-11-13 VITALS — BP 126/70 | HR 96 | Temp 98.4°F | Wt 249.5 lb

## 2015-11-13 DIAGNOSIS — J029 Acute pharyngitis, unspecified: Secondary | ICD-10-CM

## 2015-11-13 DIAGNOSIS — K209 Esophagitis, unspecified without bleeding: Secondary | ICD-10-CM

## 2015-11-13 DIAGNOSIS — T700XXA Otitic barotrauma, initial encounter: Secondary | ICD-10-CM | POA: Diagnosis not present

## 2015-11-13 NOTE — Patient Instructions (Signed)
You have a fluid/ pressure build up behind your left ear Please use your flonase twice a day for the next 7 days You can also use ibuprofen 400 mg every 8 to 12 hours as needed for pain You can use sudafed as needed Increase your stomach acid suppresser medicine to twice a day for the next 7 days If you are not better in 7-10 days or if you develop severe pain or fever, please go to ER or call the office  Barotitis Media Barotitis media is inflammation of your middle ear. This occurs when the auditory tube (eustachian tube) leading from the back of your nose (nasopharynx) to your eardrum is blocked. This blockage may result from a cold, environmental allergies, or an upper respiratory infection. Unresolved barotitis media may lead to damage or hearing loss (barotrauma), which may become permanent. HOME CARE INSTRUCTIONS   Use medicines as recommended by your health care provider. Over-the-counter medicines will help unblock the canal and can help during times of air travel.  Do not put anything into your ears to clean or unplug them. Eardrops will not be helpful.  Do not swim, dive, or fly until your health care provider says it is all right to do so. If these activities are necessary, chewing gum with frequent, forceful swallowing may help. It is also helpful to hold your nose and gently blow to pop your ears for equalizing pressure changes. This forces air into the eustachian tube.  Only take over-the-counter or prescription medicines for pain, discomfort, or fever as directed by your health care provider.  A decongestant may be helpful in decongesting the middle ear and make pressure equalization easier. SEEK MEDICAL CARE IF:  You experience a serious form of dizziness in which you feel as if the room is spinning and you feel nauseated (vertigo).  Your symptoms only involve one ear. SEEK IMMEDIATE MEDICAL CARE IF:   You develop a severe headache, dizziness, or severe ear pain.  You  have bloody or pus-like drainage from your ears.  You develop a fever.  Your problems do not improve or become worse. MAKE SURE YOU:   Understand these instructions.  Will watch your condition.  Will get help right away if you are not doing well or get worse.   This information is not intended to replace advice given to you by your health care provider. Make sure you discuss any questions you have with your health care provider.   Document Released: 03/11/2000 Document Revised: 01/02/2013 Document Reviewed: 10/09/2012 Elsevier Interactive Patient Education [employer]2016 [employer]

## 2015-11-13 NOTE — Progress Notes (Signed)
Pre visit review using our clinic review tool, if applicable. No additional management support is needed unless otherwise documented below in the visit note. 

## 2015-11-13 NOTE — Progress Notes (Signed)
Subjective:    Patient ID: Shawn Davenport, male    DOB: 10-Feb-1981, 35 y.o.   MRN: [id number]  HPI This is a 35 yo male who presents today with left ear pain. He has had mild pain during the last several weeks but has been sharp over the last several days. He has had two round of antibiotics for strep throat (azithromycin 11/03/15, clindamycin- 11/07/15, still taking) and was seen in ED with atypical chest pain 3 days ago and was diagnosed with esophagitis. He is taking an over the counter medication (?ranitidine). He has been taking Norco with good pain relief- took his last two today. Has asthma and seasonal allergies. Has flonase at home, not taking. Appetite and ability to eat slowly improving.   Past Medical History:  Diagnosis Date  . Asthma   . GERD (gastroesophageal reflux disease)    tums  . History of chicken pox    [age]yrs old  . Perennial allergic rhinitis    ragweed, dogs, cats, mold   Past Surgical History:  Procedure Laterality Date  . FOOT SURGERY Right   . HAND SURGERY Left 2013   pins for broken 5th MCP  . WRIST SURGERY Left 1999   Family History  Problem Relation Age of Onset  . Cancer Mother 6035    breast, bone  . Hyperlipidemia Father   . Hypertension Father   . Diabetes Father   . Diabetes Paternal Uncle   . Hyperlipidemia Paternal Grandfather   . Hypertension Paternal Grandfather    Social History  Substance Use Topics  . Smoking status: Former Smoker    Quit date: 07/04/2008  . Smokeless tobacco: Never Used  . Alcohol use 0.0 oz/week     Comment: once a week      Review of Systems Per HPI     Objective:   Physical Exam  Constitutional: He is oriented to person, place, and time. He appears well-developed and well-nourished. No distress.  obese  HENT:  Head: Normocephalic and atraumatic.  Right Ear: Tympanic membrane, external ear and ear canal normal.  Left Ear: Ear canal normal. No tenderness. A middle ear effusion is present.  Nose:  Rhinorrhea present.  Mouth/Throat: Uvula is midline. Posterior oropharyngeal erythema present. No oropharyngeal exudate, posterior oropharyngeal edema or tonsillar abscesses.  Eyes: Conjunctivae are normal.  Neck: Normal range of motion. Neck supple.  Cardiovascular: Normal rate, regular rhythm and normal heart sounds.   Pulmonary/Chest: Effort normal and breath sounds normal.  Lymphadenopathy:    He has no cervical adenopathy.  Neurological: He is alert and oriented to person, place, and time.  Skin: Skin is warm and dry. He is not diaphoretic.  Psychiatric: He has a normal mood and affect. His behavior is normal. Judgment and thought content normal.  Vitals reviewed.     BP 126/70 (BP Location: Left Arm, Patient Position: Sitting, Cuff Size: Large)   Pulse 96   Temp 98.4 F (36.9 C) (Oral)   Wt 249 lb 8 oz (113.2 kg)   SpO2 96%   BMI 37.11 kg/m  Wt Readings from Last 3 Encounters:  11/13/15 249 lb 8 oz (113.2 kg)  11/07/15 253 lb (114.8 kg)  11/03/15 261 lb 1.9 oz (118.4 kg)       Assessment & Plan:  1. Barotitis, initial encounter -Provided written and verbal information regarding diagnosis and treatment. - Resume flonase, can add sudafed, ibuprofen 400 mg bid/tid  2. Pharyngitis - subjectively and objectively improved, finish clindamycin -  rest (OOW today), continue good fluid consumption  3. Esophagitis - increase otc ranitidine to BID for 7 days - RTC precautions reviewed   Olean Reeeborah Glenys Snader, FNP-BC  [facility], [facility]  11/14/2015 7:12 AM

## 2015-12-29 ENCOUNTER — Ambulatory Visit (INDEPENDENT_AMBULATORY_CARE_PROVIDER_SITE_OTHER): Payer: Managed Care, Other (non HMO) | Admitting: Family Medicine

## 2015-12-29 ENCOUNTER — Encounter: Payer: Self-pay | Admitting: Family Medicine

## 2015-12-29 DIAGNOSIS — M26629 Arthralgia of temporomandibular joint, unspecified side: Secondary | ICD-10-CM | POA: Diagnosis not present

## 2015-12-29 MED ORDER — IBUPROFEN 800 MG PO TABS: 800.0000 mg | ORAL_TABLET | Freq: Three times a day (TID) | ORAL | 0 refills | Status: DC

## 2015-12-29 NOTE — Progress Notes (Signed)
Pre visit review using our clinic review tool, if applicable. No additional management support is needed unless otherwise documented below in the visit note. 

## 2015-12-29 NOTE — Progress Notes (Signed)
   Subjective:    Patient ID: Shawn Davenport, male    DOB: [birth year], 35 y.o.   MRN: [id number]  HPI  35 year old male presents  Right jaw [ain in last week.  Awoke one day with it.  Gradually worsening, not getting any better. Worse in AMs when waking up, hurts to open jaw.  Hurts to eat, chew, move jaw.  Occ clicking and popping. No fever. No tooth pain.  He has tried ibuprofen 400 mg every now and then.Marland Kitchen. Helps minimmaly.   Last dentist OV... No known tooth grinding.  BP Readings from Last 3 Encounters:  12/29/15 120/80  11/13/15 126/70  11/10/15 127/84     Review of Systems  Constitutional: Negative for fatigue.  HENT: Negative for ear pain.   Eyes: Negative for pain.  Respiratory: Negative for shortness of breath.   Cardiovascular: Negative for chest pain.  Gastrointestinal: Negative for abdominal distention.       Objective:   Physical Exam  Constitutional: Vital signs are normal. He appears well-developed and well-nourished.  HENT:  Head: Normocephalic.  Right Ear: Hearing normal. No tenderness.  Left Ear: Hearing normal. No swelling.  Nose: Nose normal. No mucosal edema or rhinorrhea.  Mouth/Throat: Oropharynx is clear and moist and mucous membranes are normal. No oropharyngeal exudate, posterior oropharyngeal edema, posterior oropharyngeal erythema or tonsillar abscesses.  Pain over right TMJ with opening and closing of jaw  Neck: Trachea normal. Carotid bruit is not present. No thyroid mass and no thyromegaly present.  Cardiovascular: Normal rate, regular rhythm and normal pulses.  Exam reveals no gallop, no distant heart sounds and no friction rub.   No murmur heard. No peripheral edema  Pulmonary/Chest: Effort normal and breath sounds normal. No respiratory distress.  Skin: Skin is warm, dry and intact. No rash noted.  Psychiatric: He has a normal mood and affect. His speech is normal and behavior is normal. Thought content normal.            Assessment & Plan:

## 2015-12-29 NOTE — Assessment & Plan Note (Signed)
Ice, NSAIDs, avoid triggers. See dentist about dental appliance.

## 2015-12-29 NOTE — Patient Instructions (Signed)
See dentist about tooth grinding for dental appliance.  Treat with cie and ibuprofen 800 mg every 8 hours for pain and inflammation.  Avoid chewing gum or other chewy foods.  Call if not improving in 1-2 weeks.  Temporomandibular Joint Syndrome Temporomandibular joint (TMJ) syndrome is a condition that affects the joints between your jaw and your skull. The TMJs are located near your ears and allow your jaw to open and close. These joints and the nearby muscles are involved in all movements of the jaw. People with TMJ syndrome have pain in the area of these joints and muscles. Chewing, biting, or other movements of the jaw can be difficult or painful. TMJ syndrome can be caused by various things. In many cases, the condition is mild and goes away within a few weeks. For some people, the condition can become a long-term problem. CAUSES Possible causes of TMJ syndrome include:  Grinding your teeth or clenching your jaw. Some people do this when they are under stress.  Arthritis.  Injury to the jaw.  Head or neck injury.  Teeth or dentures that are not aligned well. In some cases, the cause of TMJ syndrome may not be known. SIGNS AND SYMPTOMS The most common symptom is an aching pain on the side of the head in the area of the TMJ. Other symptoms may include:  Pain when moving your jaw, such as when chewing or biting.  Being unable to open your jaw all the way.  Making a clicking sound when you open your mouth.  Headache.  Earache.  Neck or shoulder pain. DIAGNOSIS Diagnosis can usually be made based on your symptoms, your medical history, and a physical exam. Your health care provider may check the range of motion of your jaw. Imaging tests, such as X-rays or an MRI, are sometimes done. You may need to see your dentist to determine if your teeth and jaw are lined up correctly. TREATMENT TMJ syndrome often goes away on its own. If treatment is needed, the options may  include:  Eating soft foods and applying ice or heat.  Medicines to relieve pain or inflammation.  Medicines to relax the muscles.  A splint, bite plate, or mouthpiece to prevent teeth grinding or jaw clenching.  Relaxation techniques or counseling to help reduce stress.  Transcutaneous electrical nerve stimulation (TENS). This helps to relieve pain by applying an electrical current through the skin.  Acupuncture. This is sometimes helpful to relieve pain.  Jaw surgery. This is rarely needed. HOME CARE INSTRUCTIONS  Take medicines only as directed by your health care provider.  Eat a soft diet if you are having trouble chewing.  Apply ice to the painful area.  Put ice in a plastic bag.  Place a towel between your skin and the bag.  Leave the ice on for 20 minutes, 2-3 times a day.  Apply a warm compress to the painful area as directed.  Massage your jaw area and perform any jaw stretching exercises as recommended by your health care provider.  If you were given a mouthpiece or bite plate, wear it as directed.  Avoid foods that require a lot of chewing. Do not chew gum.  Keep all follow-up visits as directed by your health care provider. This is important. SEEK MEDICAL CARE IF:  You are having trouble eating.  You have new or worsening symptoms. SEEK IMMEDIATE MEDICAL CARE IF:  Your jaw locks open or closed.   This information is not intended to replace  advice given to you by your health care provider. Make sure you discuss any questions you have with your health care provider.   Document Released: 12/07/2000 Document Revised: 04/04/2014 Document Reviewed: 10/17/2013 Elsevier Interactive Patient Education [employer].

## 2016-03-07 ENCOUNTER — Emergency Department (HOSPITAL_COMMUNITY): Payer: Managed Care, Other (non HMO)

## 2016-03-07 ENCOUNTER — Emergency Department (HOSPITAL_COMMUNITY)
Admission: EM | Admit: 2016-03-07 | Discharge: 2016-03-07 | Disposition: A | Discharge status: Home / Self Care | Payer: Managed Care, Other (non HMO) | Attending: Emergency Medicine | Admitting: Emergency Medicine

## 2016-03-07 ENCOUNTER — Encounter (HOSPITAL_COMMUNITY): Payer: Self-pay | Admitting: Emergency Medicine

## 2016-03-07 DIAGNOSIS — Z87891 Personal history of nicotine dependence: Secondary | ICD-10-CM | POA: Diagnosis not present

## 2016-03-07 DIAGNOSIS — Z79899 Other long term (current) drug therapy: Secondary | ICD-10-CM | POA: Insufficient documentation

## 2016-03-07 DIAGNOSIS — J4521 Mild intermittent asthma with (acute) exacerbation: Secondary | ICD-10-CM | POA: Diagnosis not present

## 2016-03-07 DIAGNOSIS — J45909 Unspecified asthma, uncomplicated: Secondary | ICD-10-CM | POA: Diagnosis present

## 2016-03-07 LAB — BASIC METABOLIC PANEL
Anion gap: 10 (ref 5–15)
BUN: 12 mg/dL (ref 6–20)
CHLORIDE: 102 mmol/L (ref 101–111)
CO2: 24 mmol/L (ref 22–32)
Calcium: 9.6 mg/dL (ref 8.9–10.3)
Creatinine, Ser: 0.88 mg/dL (ref 0.61–1.24)
GFR calc Af Amer: >60 mL/min (ref >60)
GFR calc non Af Amer: >60 mL/min (ref >60)
GLUCOSE: 129 mg/dL — AB (ref 65–99)
POTASSIUM: 3.6 mmol/L (ref 3.5–5.1)
Sodium: 136 mmol/L (ref 135–145)

## 2016-03-07 LAB — CBC
HEMATOCRIT: 46.4 % (ref 39.0–52.0)
Hemoglobin: 15.9 g/dL (ref 13.0–17.0)
MCH: 29.6 pg (ref 26.0–34.0)
MCHC: 34.3 g/dL (ref 30.0–36.0)
MCV: 86.4 fL (ref 78.0–100.0)
Platelets: 230 10*3/uL (ref 150–400)
RBC: 5.37 MIL/uL (ref 4.22–5.81)
RDW: 13.1 % (ref 11.5–15.5)
WBC: 9.5 10*3/uL (ref 4.0–10.5)

## 2016-03-07 LAB — I-STAT TROPONIN, ED: Troponin i, poc: 0 ng/mL (ref 0.00–0.08)

## 2016-03-07 MED ORDER — PREDNISONE 20 MG PO TABS
60.0000 mg | ORAL_TABLET | Freq: Once | ORAL | Status: AC
  Administered 2016-03-07: 60 mg via ORAL
  Filled 2016-03-07: qty 3

## 2016-03-07 MED ORDER — PREDNISONE 20 MG PO TABS: ORAL_TABLET | ORAL | 0 refills | Status: DC

## 2016-03-07 NOTE — ED Triage Notes (Signed)
Pt. Stated, I started being really SOB at noon and I used my inhalers and it did not work.

## 2016-03-07 NOTE — ED Notes (Signed)
Pt denies ever having CP.

## 2016-03-07 NOTE — ED Provider Notes (Signed)
[facility]-EMERGENCY DEPT Provider Note   CSN: [id number] Arrival date & time: 03/07/16  1617     History   Chief Complaint Chief Complaint  Patient presents with  . Shortness of Breath  . Asthma    HPI Shawn Davenport is a 35 y.o. male.  Patient is a 74 show male with a history of asthma who presents with wheezing and shortness of breath. He states he normally has an asthma flareup about every 6 months. He states he woke up this morning with increased wheezing. He's been using his inhaler 2-3 times throughout today and right now he feels a lot better than he has all day. He has a mild dry cough. No sputum production. No fevers. No associated chest pain. No leg pain or swelling. He denies any URI symptoms.      Past Medical History:  Diagnosis Date  . Asthma   . GERD (gastroesophageal reflux disease)    tums  . History of chicken pox    35yrs old  . Perennial allergic rhinitis    ragweed, dogs, cats, mold    Patient Active Problem List   Diagnosis Date Noted  . 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 Streptococcal sore throat 11/07/2015  . Asthma with acute exacerbation 03/25/2015  . Elevated blood pressure 07/04/2013  . Obesity 07/04/2013  . Perennial allergic rhinitis   . Asthma     Past Surgical History:  Procedure Laterality Date  . FOOT SURGERY Right   . HAND SURGERY Left 2013   pins for broken 5th MCP  . WRIST SURGERY Left 1999       Home Medications    Prior to Admission medications   Medication Sig Start Date End Date Taking? Authorizing Provider  albuterol (PROAIR HFA) 108 (90 Base) MCG/ACT inhaler Inhale 2 puffs into the lungs every 4 (four) hours as needed for wheezing or shortness of breath. 04/16/15  Yes Cristal Ford, MD  fluticasone [facility]) 50 MCG/ACT nasal spray Place 1 spray into both nostrils 2 (two) times daily as needed for allergies or rhinitis. 03/25/15  Yes Cristal Ford, MD    fluticasone-salmeterol (ADVAIR HFA) [phone] MCG/ACT inhaler Inhale 2 puffs into the lungs 2 (two) times daily. 04/16/15  Yes Cristal Ford, MD  loratadine (CLARITIN) 10 MG tablet Take 10 mg by mouth daily.   Yes Historical Provider, MD  NON FORMULARY Place 1 drop into both eyes daily as needed.   Yes Historical Provider, MD  ibuprofen (ADVIL,MOTRIN) 800 MG tablet Take 1 tablet (800 mg total) by mouth 3 (three) times daily. Patient not taking: Reported on 03/07/2016 12/29/15   Excell Seltzer, MD  predniSONE (DELTASONE) 20 MG tablet 2 tabs po daily x 4 days 03/07/16   Rolan Bucco, MD    Family History Family History  Problem Relation Age of Onset  . Cancer Mother 52    breast, bone  . Hyperlipidemia Father   . Hypertension Father   . Diabetes Father   . Diabetes Paternal Uncle   . Hyperlipidemia Paternal Grandfather   . Hypertension Paternal Grandfather     Social History Social History  Substance Use Topics  . Smoking status: Former Smoker    Quit date: 07/04/2008  . Smokeless tobacco: Never Used  . Alcohol use 0.0 oz/week     Comment: once a week     Allergies   Other and Penicillins   Review of Systems Review of Systems  Constitutional: Negative for chills, diaphoresis, fatigue  and fever.  HENT: Negative for congestion, rhinorrhea and sneezing.   Eyes: Negative.   Respiratory: Positive for cough, shortness of breath and wheezing. Negative for chest tightness.   Cardiovascular: Negative for chest pain and leg swelling.  Gastrointestinal: Negative for abdominal pain, blood in stool, diarrhea, nausea and vomiting.  Genitourinary: Negative for difficulty urinating, flank pain, frequency and hematuria.  Musculoskeletal: Negative for arthralgias and back pain.  Skin: Negative for rash.  Neurological: Negative for dizziness, speech difficulty, weakness, numbness and headaches.     Physical Exam Updated Vital Signs BP 133/97   Pulse 85   Temp 98.1 F (36.7 C)  (Oral)   Resp 17   Ht 5\' 9"  (1.753 m)   Wt 240 lb (108.9 kg)   SpO2 100%   BMI 35.44 kg/m   Physical Exam  Constitutional: He is oriented to person, place, and time. He appears well-developed and well-nourished.  HENT:  Head: Normocephalic and atraumatic.  Eyes: Pupils are equal, round, and reactive to light.  Neck: Normal range of motion. Neck supple.  Cardiovascular: Normal rate, regular rhythm and normal heart sounds.   Pulmonary/Chest: Effort normal. No respiratory distress. He has wheezes (Trace wheezing in the bases). He has no rales. He exhibits no tenderness.  Abdominal: Soft. Bowel sounds are normal. There is no tenderness. There is no rebound and no guarding.  Musculoskeletal: Normal range of motion. He exhibits no edema.  Lymphadenopathy:    He has no cervical adenopathy.  Neurological: He is alert and oriented to person, place, and time.  Skin: Skin is warm and dry. No rash noted.  Psychiatric: He has a normal mood and affect.     ED Treatments / Results  Labs (all labs ordered are listed, but only abnormal results are displayed) Labs Reviewed  BASIC METABOLIC PANEL - Abnormal; Notable for the following:       Result Value   Glucose, Bld 129 (*)    All other components within normal limits  CBC  I-STAT TROPOININ, ED    EKG  EKG Interpretation None       Radiology Dg Chest 2 View  Result Date: 03/07/2016 CLINICAL DATA:  Shortness of breath for 7 hours, history of asthma EXAM: CHEST  2 VIEW COMPARISON:  11/09/2015 FINDINGS: The heart size and mediastinal contours are within normal limits. Both lungs are clear. The visualized skeletal structures are unremarkable. IMPRESSION: No active cardiopulmonary disease. Electronically Signed   By: Jasmine PangKim  Fujinaga M.D.   On: 03/07/2016 18:42    Procedures Procedures (including critical care time)  Medications Ordered in ED Medications  predniSONE (DELTASONE) tablet 60 mg (not administered)     Initial  Impression / Assessment and Plan / ED Course  I have reviewed the triage vital signs and the nursing notes.  Pertinent labs & imaging results that were available during my care of the patient were reviewed by me and considered in my medical decision making (see chart for details).  Clinical Course     Patient presents with an asthma exacerbation. He is feeling much better now and feels that he's back to baseline. He only has trace wheezing on exam. There is no evidence of pneumonia. He has no symptoms that would be more suggestive of pulmonary embolus. I will start him on a prednisone burst. I encouraged him to use his albuterol inhaler every 4-6 hours for the next 2-3 days and then as needed after that. He was instructed to follow-up with his asthma doctor if  his symptoms are not improving or return here as needed for any worsening symptoms.  Final Clinical Impressions(s) / ED Diagnoses   Final diagnoses:  Mild intermittent asthma with exacerbation    New Prescriptions New Prescriptions   PREDNISONE (DELTASONE) 20 MG TABLET    2 tabs po daily x 4 days     Rolan BuccoMelanie Mann Skaggs, MD 03/07/16 2044

## 2016-04-19 ENCOUNTER — Other Ambulatory Visit: Payer: Self-pay | Admitting: Allergy and Immunology

## 2016-04-19 NOTE — Telephone Encounter (Signed)
Pt last seen 02/2015. Refilled 1 advair and 1 proair with no additional refills until patient has ov.

## 2016-09-19 ENCOUNTER — Emergency Department (HOSPITAL_COMMUNITY): Payer: Managed Care, Other (non HMO)

## 2016-09-19 ENCOUNTER — Emergency Department (HOSPITAL_COMMUNITY)
Admission: EM | Admit: 2016-09-19 | Discharge: 2016-09-19 | Disposition: A | Discharge status: Home / Self Care | Payer: Managed Care, Other (non HMO) | Attending: Emergency Medicine | Admitting: Emergency Medicine

## 2016-09-19 DIAGNOSIS — Z87891 Personal history of nicotine dependence: Secondary | ICD-10-CM | POA: Insufficient documentation

## 2016-09-19 DIAGNOSIS — J4531 Mild persistent asthma with (acute) exacerbation: Secondary | ICD-10-CM | POA: Insufficient documentation

## 2016-09-19 MED ORDER — FLUTICASONE-SALMETEROL 115-21 MCG/ACT IN AERO: 2.0000 | INHALATION_SPRAY | Freq: Two times a day (BID) | RESPIRATORY_TRACT | 12 refills | Status: DC

## 2016-09-19 MED ORDER — ALBUTEROL SULFATE (2.5 MG/3ML) 0.083% IN NEBU
INHALATION_SOLUTION | RESPIRATORY_TRACT | Status: AC
  Administered 2016-09-19: 2.5 mg via RESPIRATORY_TRACT
  Filled 2016-09-19: qty 3

## 2016-09-19 MED ORDER — ALBUTEROL SULFATE (2.5 MG/3ML) 0.083% IN NEBU
5.0000 mg | INHALATION_SOLUTION | Freq: Once | RESPIRATORY_TRACT | Status: DC
  Filled 2016-09-19: qty 6

## 2016-09-19 MED ORDER — ALBUTEROL SULFATE HFA 108 (90 BASE) MCG/ACT IN AERS
2.0000 | INHALATION_SPRAY | Freq: Once | RESPIRATORY_TRACT | Status: AC
  Administered 2016-09-19: 2 via RESPIRATORY_TRACT
  Filled 2016-09-19: qty 6.7

## 2016-09-19 MED ORDER — DEXAMETHASONE 4 MG PO TABS
12.0000 mg | ORAL_TABLET | Freq: Once | ORAL | Status: AC
  Administered 2016-09-19: 12 mg via ORAL
  Filled 2016-09-19: qty 3

## 2016-09-19 MED ORDER — ALBUTEROL SULFATE HFA 108 (90 BASE) MCG/ACT IN AERS: 2.0000 | INHALATION_SPRAY | RESPIRATORY_TRACT | 3 refills | Status: DC | PRN

## 2016-09-19 NOTE — ED Provider Notes (Signed)
[facility]-EMERGENCY DEPT Provider Note   CSN: [id number] Arrival date & time: 09/19/16  1659   By signing my name below, I, Clarisse Gouge, attest that this documentation has been prepared under the direction and in the presence of Verdie Mosher, Neysa Bonito, MD. Electronically signed, Clarisse Gouge, ED Scribe. 09/19/16. 7:49 PM.   History   Chief Complaint Chief Complaint  Patient presents with  . Shortness of Breath    out of asthma medications x 1 week   The history is provided by the patient and medical records. No language interpreter was used.  Shortness of Breath  This is a chronic problem. The average episode lasts 1 week. The problem occurs frequently.The current episode started more than 2 days ago. The problem has been gradually worsening. Pertinent negatives include no fever, no headaches, no rhinorrhea, no sore throat, no cough, no sputum production, no wheezing, no chest pain, no vomiting, no abdominal pain, no leg pain and no leg swelling. The problem's precipitants include exercise and weather/humidity. Associated medical issues include asthma.    Shawn Davenport is a 36 y.o. male with h/o asthma presenting to the Emergency Department concerning acute on chronic SOB worsening x 1 week. Pt allegedly ran out of his rescue inhaler and Advair ~1 week ago; he states he has been unable to get these medications refilled d/t lack of insurance. He states his symptoms improved after a breathing treatment administered prior to evaluation in [facility] ED. No fever, cough, congestion, sore throat, abdominal pain, N/V, leg swelling, leg pain or chest pain. No other complaints at this time.   Past Medical History:  Diagnosis Date  . Asthma   . GERD (gastroesophageal reflux disease)    tums  . History of chicken pox    36yrs old  . Perennial allergic rhinitis    ragweed, dogs, cats, mold    Patient Active Problem List   Diagnosis Date Noted  . Temporomandibular joint-pain-dysfunction syndrome (TMJ)  12/29/2015  . Streptococcal sore throat 11/07/2015  . Asthma with acute exacerbation 03/25/2015  . Elevated blood pressure 07/04/2013  . Obesity 07/04/2013  . Perennial allergic rhinitis   . Asthma     Past Surgical History:  Procedure Laterality Date  . FOOT SURGERY Right   . HAND SURGERY Left 2013   pins for broken 5th MCP  . WRIST SURGERY Left 1999       Home Medications    Prior to Admission medications   Medication Sig Start Date End Date Taking? Authorizing Provider  ADVAIR HFA 115-21 MCG/ACT inhaler INHALE 2 PUFFS INTO THE LUNGS 2 (TWO) TIMES DAILY. 04/19/16   Bobbitt, Heywood Iles, MD  albuterol (PROVENTIL HFA;VENTOLIN HFA) 108 (90 Base) MCG/ACT inhaler Inhale 2 puffs into the lungs every 4 (four) hours as needed for wheezing or shortness of breath. 09/19/16   Lavera Guise, MD  fluticasone [facility]) 50 MCG/ACT nasal spray Place 1 spray into both nostrils 2 (two) times daily as needed for allergies or rhinitis. 03/25/15   Bobbitt, Heywood Iles, MD  fluticasone-salmeterol (ADVAIR HFA) 409-81 MCG/ACT inhaler Inhale 2 puffs into the lungs 2 (two) times daily. 09/19/16   Lavera Guise, MD  ibuprofen (ADVIL,MOTRIN) 800 MG tablet Take 1 tablet (800 mg total) by mouth 3 (three) times daily. Patient not taking: Reported on 03/07/2016 12/29/15   Excell Seltzer, MD  loratadine (CLARITIN) 10 MG tablet Take 10 mg by mouth daily.    [provider]  NON FORMULARY Place 1 drop into both  eyes daily as needed.    [provider]  predniSONE (DELTASONE) 20 MG tablet 2 tabs po daily x 4 days 03/07/16   Rolan Bucco, MD  PROAIR HFA 108 [phone] Base) MCG/ACT inhaler INHALE TWO PUFFS EVERY 4-6 HOURS AS NEEDED FOR COUGH OR WHEEZE. 04/19/16   Bobbitt, Heywood Iles, MD    Family History Family History  Problem Relation Age of Onset  . Cancer Mother 68       breast, bone  . Hyperlipidemia Father   . Hypertension Father   . Diabetes Father   . Diabetes Paternal Uncle   .  Hyperlipidemia Paternal Grandfather   . Hypertension Paternal Grandfather     Social History Social History  Substance Use Topics  . Smoking status: Former Smoker    Quit date: 07/04/2008  . Smokeless tobacco: Never Used  . Alcohol use 0.0 oz/week     Comment: once a week     Allergies   Other and Penicillins   Review of Systems Review of Systems  Constitutional: Negative for fever.  HENT: Negative for rhinorrhea and sore throat.   Respiratory: Positive for shortness of breath. Negative for cough, sputum production and wheezing.   Cardiovascular: Negative for chest pain and leg swelling.  Gastrointestinal: Negative for abdominal pain and vomiting.  Neurological: Negative for headaches.  All other systems reviewed and are negative.    Physical Exam Updated Vital Signs BP (!) 151/102 (BP Location: Left Arm)   Pulse 67   Temp 98.1 F (36.7 C) (Oral)   Resp 16   Ht 5\' 9"  (1.753 m)   Wt 240 lb (108.9 kg)   SpO2 97%   BMI 35.44 kg/m   Physical Exam Physical Exam  Nursing note and vitals reviewed. Constitutional: Well developed, well nourished, non-toxic, and in no acute distress Head: Normocephalic and atraumatic.  Mouth/Throat: Oropharynx is clear and moist.  Neck: Normal range of motion. Neck supple.  Cardiovascular: Normal rate and regular rhythm.   Pulmonary/Chest: Effort normal and breath sounds normal.  Abdominal: Soft. There is no tenderness. There is no rebound and no guarding.  Musculoskeletal: Normal range of motion.  Neurological: Alert, no facial droop, fluent speech, moves all extremities symmetrically Skin: Skin is warm and dry.  Psychiatric: Cooperative   ED Treatments / Results  DIAGNOSTIC STUDIES: Oxygen Saturation is 97% on RA, NL by my interpretation.    COORDINATION OF CARE: 7:46 PM-Discussed next steps with pt. Pt verbalized understanding and is agreeable with the plan. Will Rx medications and provide resources.   Labs (all labs ordered  are listed, but only abnormal results are displayed) Labs Reviewed - No data to display  EKG  EKG Interpretation None       Radiology Dg Chest 2 View  Result Date: 09/19/2016 CLINICAL DATA:  Shortness of breath and chest tightness for 1 day EXAM: CHEST  2 VIEW COMPARISON:  03/07/2016 FINDINGS: The heart size and mediastinal contours are within normal limits. Both lungs are clear. The visualized skeletal structures are unremarkable. IMPRESSION: No active cardiopulmonary disease. Electronically Signed   By: Jasmine Pang M.D.   On: 09/19/2016 18:40    Procedures Procedures (including critical care time)  Medications Ordered in ED Medications  dexamethasone (DECADRON) tablet 12 mg (not administered)  albuterol (PROVENTIL HFA;VENTOLIN HFA) 108 (90 Base) MCG/ACT inhaler 2 puff (not administered)  albuterol (PROVENTIL) (2.5 MG/3ML) 0.083% nebulizer solution (2.5 mg Inhalation Given 09/19/16 1804)     Initial Impression / Assessment and Plan /  ED Course  I have reviewed the triage vital signs and the nursing notes.  Pertinent labs & imaging results that were available during my care of the patient were reviewed by me and considered in my medical decision making (see chart for details).     36 year old male who presents with intermittent shortness of breath and wheezing in the setting of running out of his asthma medications. Received albuterol nebulizer prior to my evaluation, and and he appears very well appearing, breathing comfortably on room air, with normal oxygenation. Lungs are clear. Remainder of exam is unremarkable. Patient is prescribed his Advair and albuterol. Single dose of Decadron is given as well for mild exacerbation. Strict return and follow-up instructions reviewed. He expressed understanding of all discharge instructions and felt comfortable with the plan of care.  Final Clinical Impressions(s) / ED Diagnoses   Final diagnoses:  Mild persistent asthma with acute  exacerbation    New Prescriptions New Prescriptions   ALBUTEROL (PROVENTIL HFA;VENTOLIN HFA) 108 (90 BASE) MCG/ACT INHALER    Inhale 2 puffs into the lungs every 4 (four) hours as needed for wheezing or shortness of breath.   FLUTICASONE-SALMETEROL (ADVAIR HFA) 115-21 MCG/ACT INHALER    Inhale 2 puffs into the lungs 2 (two) times daily.   I personally performed the services described in this documentation, which was scribed in my presence. The recorded information has been reviewed and is accurate.    Lavera GuiseLiu, Saskia Simerson Duo, MD 09/19/16 [phone]

## 2016-09-19 NOTE — Discharge Instructions (Signed)
Please return without fail for worsening symptoms, including fever, difficulty breathing, passing out, or any other symptoms concerning to you.

## 2016-09-19 NOTE — ED Triage Notes (Signed)
Patient present with increased SOB, and says "my asthma starts messing with me when I try to do stuff". Out of Advair and inhaler - no more insurance.

## 2016-09-23 MED FILL — **ADVAIR HFA 115-21 MCG INH: 115-21 MCG | 15 days supply | Qty: 8 | Fill #0

## 2016-09-23 MED FILL — !VENTOLIN HFA INHALER: 108 (90 BAS | 18 days supply | Qty: 18 | Fill #0

## 2017-01-11 ENCOUNTER — Emergency Department (HOSPITAL_COMMUNITY)
Admission: EM | Admit: 2017-01-11 | Discharge: 2017-01-11 | Disposition: A | Discharge status: Home / Self Care | Payer: Self-pay | Attending: Emergency Medicine | Admitting: Emergency Medicine

## 2017-01-11 ENCOUNTER — Encounter (HOSPITAL_COMMUNITY): Payer: Self-pay | Admitting: Emergency Medicine

## 2017-01-11 DIAGNOSIS — J45901 Unspecified asthma with (acute) exacerbation: Secondary | ICD-10-CM | POA: Insufficient documentation

## 2017-01-11 DIAGNOSIS — Z79899 Other long term (current) drug therapy: Secondary | ICD-10-CM | POA: Insufficient documentation

## 2017-01-11 DIAGNOSIS — Z87891 Personal history of nicotine dependence: Secondary | ICD-10-CM | POA: Insufficient documentation

## 2017-01-11 MED ORDER — ALBUTEROL SULFATE (2.5 MG/3ML) 0.083% IN NEBU
INHALATION_SOLUTION | RESPIRATORY_TRACT | Status: AC
  Filled 2017-01-11: qty 6

## 2017-01-11 MED ORDER — ALBUTEROL SULFATE HFA 108 (90 BASE) MCG/ACT IN AERS: 1.0000 | INHALATION_SPRAY | Freq: Four times a day (QID) | RESPIRATORY_TRACT | 0 refills | Status: DC | PRN

## 2017-01-11 MED ORDER — FLUTICASONE-SALMETEROL 115-21 MCG/ACT IN AERO: 2.0000 | INHALATION_SPRAY | Freq: Two times a day (BID) | RESPIRATORY_TRACT | 0 refills | Status: DC

## 2017-01-11 MED ORDER — MOMETASONE FURO-FORMOTEROL FUM 200-5 MCG/ACT IN AERO
2.0000 | INHALATION_SPRAY | Freq: Two times a day (BID) | RESPIRATORY_TRACT | Status: DC
  Administered 2017-01-11: 2 via RESPIRATORY_TRACT
  Filled 2017-01-11: qty 8.8

## 2017-01-11 MED ORDER — ALBUTEROL SULFATE (2.5 MG/3ML) 0.083% IN NEBU
5.0000 mg | INHALATION_SOLUTION | Freq: Once | RESPIRATORY_TRACT | Status: AC
  Administered 2017-01-11: 5 mg via RESPIRATORY_TRACT
  Filled 2017-01-11: qty 6

## 2017-01-11 MED ORDER — ALBUTEROL SULFATE HFA 108 (90 BASE) MCG/ACT IN AERS
1.0000 | INHALATION_SPRAY | Freq: Once | RESPIRATORY_TRACT | Status: AC
  Administered 2017-01-11: 1 via RESPIRATORY_TRACT
  Filled 2017-01-11: qty 6.7

## 2017-01-11 MED ORDER — DEXAMETHASONE SODIUM PHOSPHATE 10 MG/ML IJ SOLN
10.0000 mg | Freq: Once | INTRAMUSCULAR | Status: AC
  Administered 2017-01-11: 10 mg via INTRAMUSCULAR
  Filled 2017-01-11: qty 1

## 2017-01-11 NOTE — ED Triage Notes (Signed)
Pt to ER complaining of worsening asthma symptoms after running out of inhalers one week ago. Pt in NAD. No wheezing auscultated. Pt states is here for new prescriptions.

## 2017-01-11 NOTE — ED Provider Notes (Signed)
Shawn Davenport [facility] EMERGENCY DEPARTMENT Provider Note   CSN: [id number] Arrival date & time: 01/11/17  1841     History   Chief Complaint Chief Complaint  Patient presents with  . Asthma    HPI Shawn Davenport is a 36 y.o. male with a past medical history of asthma, presents to ED for evaluation of asthma symptoms for the past week. He states that he ran his albuterol and Advair inhalers. He states that this feels similar to his previous asthma exacerbations. He reports mild wheezing throughout the week. E would also like refills of his inhalers. He denies any cough, chest pain, trouble breathing, trouble swallowing, fevers.  HPI  Past Medical History:  Diagnosis Date  . Asthma   . GERD (gastroesophageal reflux disease)    tums  . History of chicken pox    36yrs old  . Perennial allergic rhinitis    ragweed, dogs, cats, mold    Patient Active Problem List   Diagnosis Date Noted  . 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 Streptococcal sore throat 11/07/2015  . Asthma with acute exacerbation 03/25/2015  . Elevated blood pressure 07/04/2013  . Obesity 07/04/2013  . Perennial allergic rhinitis   . Asthma     Past Surgical History:  Procedure Laterality Date  . FOOT SURGERY Right   . HAND SURGERY Left 2013   pins for broken 5th MCP  . WRIST SURGERY Left 1999       Home Medications    Prior to Admission medications   Medication Sig Start Date End Date Taking? Authorizing Provider  albuterol (PROVENTIL HFA;VENTOLIN HFA) 108 (90 Base) MCG/ACT inhaler Inhale 1-2 puffs into the lungs every 6 (six) hours as needed for wheezing or shortness of breath. 01/11/17   Afifa Truax, PA-C  fluticasone (FLONASE) 50 MCG/ACT nasal spray Place 1 spray into both nostrils 2 (two) times daily as needed for allergies or rhinitis. 03/25/15   Bobbitt, Heywood Iles, MD  fluticasone-salmeterol (ADVAIR HFA) 409-81 MCG/ACT inhaler Inhale 2 puffs  into the lungs 2 (two) times daily. 01/11/17   Draper Gallon, PA-C  ibuprofen (ADVIL,MOTRIN) 800 MG tablet Take 1 tablet (800 mg total) by mouth 3 (three) times daily. Patient not taking: Reported on 03/07/2016 12/29/15   Excell Seltzer, MD  loratadine (CLARITIN) 10 MG tablet Take 10 mg by mouth daily.    [provider]  NON FORMULARY Place 1 drop into both eyes daily as needed.    [provider]  predniSONE (DELTASONE) 20 MG tablet 2 tabs po daily x 4 days 03/07/16   Rolan Bucco, MD    Family History Family History  Problem Relation Age of Onset  . Cancer Mother 2       breast, bone  . Hyperlipidemia Father   . Hypertension Father   . Diabetes Father   . Diabetes Paternal Uncle   . Hyperlipidemia Paternal Grandfather   . Hypertension Paternal Grandfather     Social History Social History  Substance Use Topics  . Smoking status: Former Smoker    Quit date: 07/04/2008  . Smokeless tobacco: Never Used  . Alcohol use 0.0 oz/week     Comment: once a week     Allergies   Other and Penicillins   Review of Systems Review of Systems  Constitutional: Negative for chills and fever.  Respiratory: Positive for wheezing. Negative for cough, chest tightness and shortness of breath.   Cardiovascular: Negative for chest pain.  Skin: Negative for  rash.     Physical Exam Updated Vital Signs BP (!) 158/114 (BP Location: Left Arm)   Pulse 78   Temp 97.8 F (36.6 C) (Oral)   Resp 18   SpO2 99%   Physical Exam  Constitutional: He appears well-developed and well-nourished. No distress.  Nontoxic appearing and in no acute distress. Does appear somewhat anxious.  HENT:  Head: Normocephalic and atraumatic.  Eyes: Conjunctivae and EOM are normal. No scleral icterus.  Neck: Normal range of motion.  Cardiovascular: Normal rate, regular rhythm and normal heart sounds.   Pulmonary/Chest: Effort normal and breath sounds normal. No respiratory distress.  No wheezing  noted. Lungs clear to auscultation with symmetrical chest rise.  Neurological: He is alert.  Skin: No rash noted. He is not diaphoretic.  Psychiatric: He has a normal mood and affect.  Nursing note and vitals reviewed.    ED Treatments / Results  Labs (all labs ordered are listed, but only abnormal results are displayed) Labs Reviewed - No data to display  EKG  EKG Interpretation None       Radiology No results found.  Procedures Procedures (including critical care time)  Medications Ordered in ED Medications  albuterol (PROVENTIL) (2.5 MG/3ML) 0.083% nebulizer solution (  Not Given 01/11/17 1919)  dexamethasone (DECADRON) injection 10 mg (not administered)  albuterol (PROVENTIL HFA;VENTOLIN HFA) 108 (90 Base) MCG/ACT inhaler 1 puff (not administered)  mometasone-formoterol (DULERA) 200-5 MCG/ACT inhaler 2 puff (not administered)  albuterol (PROVENTIL) (2.5 MG/3ML) 0.083% nebulizer solution 5 mg (5 mg Nebulization Given 01/11/17 1902)     Initial Impression / Assessment and Plan / ED Course  I have reviewed the triage vital signs and the nursing notes.  Pertinent labs & imaging results that were available during my care of the patient were reviewed by me and considered in my medical decision making (see chart for details).     Patient presents to ED for evaluation of asthma for the past week. States that this feels similar to his previous asthma exacerbations. He does complain of wheezing intermittently for the past week. States he also ran out of his inhalers. On physical exam patient did receive a breathing treatment before my evaluation. There is no wheezing noted on my examination. He is breathing comfortably on room air with normal oxygenation. He is overall well-appearing.Patient given inhalers here in the ED as well as Decadron. Advised to follow-up [place] vomitus for further evaluation. Patient appears stable for discharge at this time. Strict return  precautions given.  Final Clinical Impressions(s) / ED Diagnoses   Final diagnoses:  Moderate asthma with exacerbation, unspecified whether persistent    New Prescriptions New Prescriptions   ALBUTEROL (PROVENTIL HFA;VENTOLIN HFA) 108 (90 BASE) MCG/ACT INHALER    Inhale 1-2 puffs into the lungs every 6 (six) hours as needed for wheezing or shortness of breath.     Dietrich PatesKhatri, Jud Fanguy, PA-C 01/11/17 1941    Rolland PorterJames, Mark, MD 01/15/17 [phone]

## 2017-01-11 NOTE — Discharge Instructions (Signed)
Please read attached information regarding your condition. Take albuterol inhaler as needed. Take Advair daily as directed. Return to ED for worsening wheezing, trouble breathing, trouble swallowing, chest pain, productive cough.

## 2017-04-24 ENCOUNTER — Other Ambulatory Visit: Payer: Self-pay

## 2017-04-24 ENCOUNTER — Emergency Department (HOSPITAL_COMMUNITY)
Admission: EM | Admit: 2017-04-24 | Discharge: 2017-04-24 | Disposition: A | Discharge status: Home / Self Care | Payer: Self-pay | Attending: Emergency Medicine | Admitting: Emergency Medicine

## 2017-04-24 ENCOUNTER — Encounter (HOSPITAL_COMMUNITY): Payer: Self-pay | Admitting: Emergency Medicine

## 2017-04-24 DIAGNOSIS — J45909 Unspecified asthma, uncomplicated: Secondary | ICD-10-CM | POA: Insufficient documentation

## 2017-04-24 DIAGNOSIS — Z79899 Other long term (current) drug therapy: Secondary | ICD-10-CM | POA: Insufficient documentation

## 2017-04-24 DIAGNOSIS — Z760 Encounter for issue of repeat prescription: Secondary | ICD-10-CM | POA: Insufficient documentation

## 2017-04-24 DIAGNOSIS — Z87891 Personal history of nicotine dependence: Secondary | ICD-10-CM | POA: Insufficient documentation

## 2017-04-24 MED ORDER — MOMETASONE FURO-FORMOTEROL FUM 100-5 MCG/ACT IN AERO
2.0000 | INHALATION_SPRAY | Freq: Two times a day (BID) | RESPIRATORY_TRACT | Status: DC
  Administered 2017-04-24: 2 via RESPIRATORY_TRACT
  Filled 2017-04-24: qty 8.8

## 2017-04-24 NOTE — ED Provider Notes (Signed)
[facility] EMERGENCY DEPARTMENT Provider Note   CSN: [id number] Arrival date & time: 04/24/17  1850     History   Chief Complaint Chief Complaint  Patient presents with  . Medication Refill    HPI Shawn Davenport is a 37 y.o. male presenting for medication refill.  Patient states he has a history of asthma, is on a daily steroid inhaler and albuterol rescue inhaler as needed.  He ran out of his steroid inhaler last week, and since then, has had increased use of his rescue inhaler.  He reports he has had a head cold for the past week, including nasal congestion, cough, and sore throat, however this is improving.  Today, he reports he used his inhaler 4 times.  He is requesting refill of steroid inhaler.  He states he does not have a primary care doctor, as he no longer has insurance.  He tried to follow-up with [place] [facility] after last month's visit, but was told they were not taking new patients and to try again in the new year.  He has not tried that since.  He denies fevers, chills, nasal congestion, sore throat, chest pain, difficulty breathing, wheezing, nausea, vomiting, abdominal pain.  He states he does not want steroids today, as he feels he is not having an asthma exacerbation.  He states when he was here last time, he was given albuterol inhaler and Dulera.   HPI  Past Medical History:  Diagnosis Date  . Asthma   . GERD (gastroesophageal reflux disease)    tums  . History of chicken pox    [age]yrs old  . Perennial allergic rhinitis    ragweed, dogs, cats, mold    Patient Active Problem List   Diagnosis Date Noted  . 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 Streptococcal sore throat 11/07/2015  . Asthma with acute exacerbation 03/25/2015  . Elevated blood pressure 07/04/2013  . Obesity 07/04/2013  . Perennial allergic rhinitis   . Asthma     Past Surgical History:  Procedure Laterality Date  . FOOT  SURGERY Right   . HAND SURGERY Left 2013   pins for broken 5th MCP  . WRIST SURGERY Left 1999       Home Medications    Prior to Admission medications   Medication Sig Start Date End Date Taking? Authorizing Provider  albuterol (PROVENTIL HFA;VENTOLIN HFA) 108 (90 Base) MCG/ACT inhaler Inhale 1-2 puffs into the lungs every 6 (six) hours as needed for wheezing or shortness of breath. 01/11/17   Khatri, Hina, PA-C  fluticasone (FLONASE) 50 MCG/ACT nasal spray Place 1 spray into both nostrils 2 (two) times daily as needed for allergies or rhinitis. 03/25/15   Bobbitt, Heywood Ilesalph Carter, MD  fluticasone-salmeterol (ADVAIR HFA) [phone] MCG/ACT inhaler Inhale 2 puffs into the lungs 2 (two) times daily. 01/11/17   Khatri, Hina, PA-C  ibuprofen (ADVIL,MOTRIN) 800 MG tablet Take 1 tablet (800 mg total) by mouth 3 (three) times daily. Patient not taking: Reported on 03/07/2016 12/29/15   Excell SeltzerBedsole, Amy E, MD  loratadine (CLARITIN) 10 MG tablet Take 10 mg by mouth daily.    [provider]  NON FORMULARY Place 1 drop into both eyes daily as needed.    [provider]  predniSONE (DELTASONE) 20 MG tablet 2 tabs po daily x 4 days 03/07/16   Rolan BuccoBelfi, Melanie, MD    Family History Family History  Problem Relation Age of Onset  . Cancer Mother 935  breast, bone  . Hyperlipidemia Father   . Hypertension Father   . Diabetes Father   . Diabetes Paternal Uncle   . Hyperlipidemia Paternal Grandfather   . Hypertension Paternal Grandfather     Social History Social History   Tobacco Use  . Smoking status: Former Smoker    Last attempt to quit: 07/04/2008    Years since quitting: 8.8  . Smokeless tobacco: Never Used  Substance Use Topics  . Alcohol use: Yes    Alcohol/week: 0.0 oz    Comment: once a week  . Drug use: No     Allergies   Other and Penicillins   Review of Systems Review of Systems  Constitutional: Negative for chills and fever.  Respiratory: Positive for  cough. Negative for chest tightness, shortness of breath and wheezing.      Physical Exam Updated Vital Signs BP (!) 150/101 (BP Location: Right Arm)   Pulse 74   Temp 99 F (37.2 C) (Oral)   Resp 18   SpO2 97%   Physical Exam  Constitutional: He is oriented to person, place, and time. He appears well-developed and well-nourished. No distress.  HENT:  Head: Normocephalic and atraumatic.  Right Ear: Tympanic membrane, external ear and ear canal normal.  Left Ear: Tympanic membrane, external ear and ear canal normal.  Nose: Nose normal.  Mouth/Throat: Uvula is midline, oropharynx is clear and moist and mucous membranes are normal.  Eyes: EOM are normal.  Neck: Normal range of motion.  Cardiovascular: Normal rate, regular rhythm and intact distal pulses.  Pulmonary/Chest: Effort normal and breath sounds normal. No respiratory distress. He has no wheezes. He has no rales.  Clear lung sounds without wheezing  Abdominal: Soft. He exhibits no distension. There is no tenderness.  Musculoskeletal: Normal range of motion.  Neurological: He is alert and oriented to person, place, and time.  Skin: Skin is warm. No rash noted.  Psychiatric: He has a normal mood and affect.  Nursing note and vitals reviewed.    ED Treatments / Results  Labs (all labs ordered are listed, but only abnormal results are displayed) Labs Reviewed - No data to display  EKG  EKG Interpretation None       Radiology No results found.  Procedures Procedures (including critical care time)  Medications Ordered in ED Medications - No data to display   Initial Impression / Assessment and Plan / ED Course  I have reviewed the triage vital signs and the nursing notes.  Pertinent labs & imaging results that were available during my care of the patient were reviewed by me and considered in my medical decision making (see chart for details).     Pt presenting for medication refill of steroid inhaler.   Physical examination, he is afebrile not tachycardic.  Patient reports mild URI last week, which is improving.  Only cough remains.  He reports increased use of his albuterol inhaler, as he no longer has his daily steroid inhaler.  Discussed with patient that the ER is not the place for medication refills, and he needs to establish primary care.  Discussed he may have difficulty obtaining steroid inhalers in the future, as he has already had multiple refills from the ER.  Encouraged patient to follow-up with [facility] again to try to establish primary care.  Currently, patient without wheezing, shortness of breath, or chest tightness.  Lungs clear.  Patient does not want steroid, as he feels he is not having an asthma  exacerbation.  Refill of Dulera given, patient does not need refill of albuterol inhaler.  At this time, patient appears safe for discharge.  Return precautions given.  Patient states he understands and agrees to plan.   Final Clinical Impressions(s) / ED Diagnoses   Final diagnoses:  Medication refill  Uncomplicated asthma, unspecified asthma severity, unspecified whether persistent    ED Discharge Orders    None       Alveria Apley, PA-C 04/25/17 0113    Nira Conn, MD 04/26/17 [phone]

## 2017-04-24 NOTE — ED Triage Notes (Signed)
Pt reports having to use his rescue inhaler more often b/c he is our of his "steriod inhaler."  Pt is not currently sob, lungs are clear bilaterally.

## 2017-04-24 NOTE — Discharge Instructions (Signed)
Follow-up with [place] [facility] to establish primary care for evaluation and management of your asthma. If they are not taking new patients, you may contact the [phone] number the back of the paperwork to help you establish primary care. Return to the ER if you are developing difficulty breathing, shortness of breath not improving with your inhaler, or any new or concerning symptoms.

## 2017-07-03 ENCOUNTER — Encounter (HOSPITAL_COMMUNITY): Payer: Self-pay | Admitting: *Deleted

## 2017-07-03 ENCOUNTER — Emergency Department (HOSPITAL_COMMUNITY)
Admission: EM | Admit: 2017-07-03 | Discharge: 2017-07-04 | Disposition: A | Discharge status: Home / Self Care | Payer: Self-pay | Attending: Emergency Medicine | Admitting: Emergency Medicine

## 2017-07-03 DIAGNOSIS — J45909 Unspecified asthma, uncomplicated: Secondary | ICD-10-CM | POA: Insufficient documentation

## 2017-07-03 DIAGNOSIS — S0125XA Open bite of nose, initial encounter: Secondary | ICD-10-CM | POA: Insufficient documentation

## 2017-07-03 DIAGNOSIS — Y929 Unspecified place or not applicable: Secondary | ICD-10-CM | POA: Insufficient documentation

## 2017-07-03 DIAGNOSIS — S0185XA Open bite of other part of head, initial encounter: Secondary | ICD-10-CM

## 2017-07-03 DIAGNOSIS — Z23 Encounter for immunization: Secondary | ICD-10-CM | POA: Insufficient documentation

## 2017-07-03 DIAGNOSIS — Y939 Activity, unspecified: Secondary | ICD-10-CM | POA: Insufficient documentation

## 2017-07-03 DIAGNOSIS — Z87891 Personal history of nicotine dependence: Secondary | ICD-10-CM | POA: Insufficient documentation

## 2017-07-03 DIAGNOSIS — W540XXA Bitten by dog, initial encounter: Secondary | ICD-10-CM | POA: Insufficient documentation

## 2017-07-03 DIAGNOSIS — Y999 Unspecified external cause status: Secondary | ICD-10-CM | POA: Insufficient documentation

## 2017-07-03 MED ORDER — TETANUS-DIPHTH-ACELL PERTUSSIS 5-2.5-18.5 LF-MCG/0.5 IM SUSP
0.5000 mL | Freq: Once | INTRAMUSCULAR | Status: AC
  Administered 2017-07-04: 0.5 mL via INTRAMUSCULAR
  Filled 2017-07-03: qty 0.5

## 2017-07-03 NOTE — ED Provider Notes (Signed)
[facility]-EMERGENCY DEPT Provider Note   CSN: [id number] Arrival date & time: 07/03/17  1642     History   Chief Complaint Chief Complaint  Patient presents with  . Animal Bite    HPI Shawn Davenport is a 37 y.o. male.  Patient here with wife and reports that around 3:45 pm today he was bitten by a neighbor's dog bite to face causing a superficial laceration to the nose. He knows that the dog has not received its vaccinations for rabies and the dog is usually outdoors. Prior to arrival the patient contacted Animal Control and the dog is now in quarantine under observation for development of ss/sxs of rabies.     The history is provided by the patient. No language interpreter was used.  Animal Bite    Past Medical History:  Diagnosis Date  . Asthma   . GERD (gastroesophageal reflux disease)    tums  . History of chicken pox    [age]yrs old  . Perennial allergic rhinitis    ragweed, dogs, cats, mold    Patient Active Problem List   Diagnosis Date Noted  . 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 Streptococcal sore throat 11/07/2015  . Asthma with acute exacerbation 03/25/2015  . Elevated blood pressure 07/04/2013  . Obesity 07/04/2013  . Perennial allergic rhinitis   . Asthma     Past Surgical History:  Procedure Laterality Date  . FOOT SURGERY Right   . HAND SURGERY Left 2013   pins for broken 5th MCP  . WRIST SURGERY Left 1999        Home Medications    Prior to Admission medications   Medication Sig Start Date End Date Taking? Authorizing Provider  albuterol (PROVENTIL HFA;VENTOLIN HFA) 108 (90 Base) MCG/ACT inhaler Inhale 1-2 puffs into the lungs every 6 (six) hours as needed for wheezing or shortness of breath. 01/11/17   Khatri, Hina, PA-C  fluticasone (FLONASE) 50 MCG/ACT nasal spray Place 1 spray into both nostrils 2 (two) times daily as needed for allergies or rhinitis. 03/25/15   Bobbitt, Heywood Ilesalph  Carter, MD  fluticasone-salmeterol (ADVAIR HFA) 782-95115-21 MCG/ACT inhaler Inhale 2 puffs into the lungs 2 (two) times daily. 01/11/17   Khatri, Hina, PA-C  ibuprofen (ADVIL,MOTRIN) 800 MG tablet Take 1 tablet (800 mg total) by mouth 3 (three) times daily. Patient not taking: Reported on 03/07/2016 12/29/15   Excell SeltzerBedsole, Amy E, MD  loratadine (CLARITIN) 10 MG tablet Take 10 mg by mouth daily.    [provider]  NON FORMULARY Place 1 drop into both eyes daily as needed.    [provider]  predniSONE (DELTASONE) 20 MG tablet 2 tabs po daily x 4 days 03/07/16   Rolan BuccoBelfi, Melanie, MD    Family History Family History  Problem Relation Age of Onset  . Cancer Mother 8735       breast, bone  . Hyperlipidemia Father   . Hypertension Father   . Diabetes Father   . Diabetes Paternal Uncle   . Hyperlipidemia Paternal Grandfather   . Hypertension Paternal Grandfather     Social History Social History   Tobacco Use  . Smoking status: Former Smoker    Last attempt to quit: 07/04/2008    Years since quitting: 9.0  . Smokeless tobacco: Never Used  Substance Use Topics  . Alcohol use: Yes    Alcohol/week: 0.0 oz    Comment: once a week  . Drug use: No  Allergies   Other and Penicillins   Review of Systems Review of Systems  HENT: Positive for facial swelling.   Eyes: Negative for pain and redness.  Gastrointestinal: Negative for nausea.  Musculoskeletal: Negative for myalgias and neck pain.  Skin: Positive for wound.  Neurological: Negative for headaches.     Physical Exam Updated Vital Signs BP (!) 178/116 (BP Location: Left Arm)   Pulse 80   Temp 98.9 F (37.2 C) (Oral)   Resp 18   SpO2 95%   Physical Exam  Constitutional: He is oriented to person, place, and time. He appears well-developed and well-nourished.  HENT:  Nose is slightly swollen on left side with superficial abrasion. Nonsuturable. No intranasal abnormality.  Neck: Normal range of motion. Neck  supple.  Pulmonary/Chest: Effort normal.  Musculoskeletal: Normal range of motion.  Neurological: He is alert and oriented to person, place, and time.  Skin: Skin is warm and dry.  Psychiatric: He has a normal mood and affect.     ED Treatments / Results  Labs (all labs ordered are listed, but only abnormal results are displayed) Labs Reviewed - No data to display  EKG None  Radiology No results found.  Procedures Procedures (including critical care time)  Medications Ordered in ED Medications - No data to display   Initial Impression / Assessment and Plan / ED Course  I have reviewed the triage vital signs and the nursing notes.  Pertinent labs & imaging results that were available during my care of the patient were reviewed by me and considered in my medical decision making (see chart for details).     Patient here after dog bite - dog in Animal Control custody. Rabies series not initiated tonight. Discussed safety with patient and SO. All questions answered.   Tetanus is updated. Wound care instructions provided. He is stable for discharge home.  Final Clinical Impressions(s) / ED Diagnoses   Final diagnoses:  None   1. Dog bite  ED Discharge Orders    None       Elpidio Anis, Cordelia Poche 07/05/17 0109    Molpus, Jonny Ruiz, MD 07/05/17 [phone]

## 2017-07-03 NOTE — Discharge Instructions (Signed)
Animal Control will notify you of any concerning symptoms in the dog being watched that would be concerning for rabies, at which time they may recommend the vaccination series.

## 2017-07-03 NOTE — ED Triage Notes (Signed)
Per EMS, pt from home was bitten on his nose by a dog. Pt has laceration on left side of nose. The owners do not have vaccination records for the dog.   BP 190/120 HR 90s

## 2017-07-20 ENCOUNTER — Other Ambulatory Visit: Payer: Self-pay

## 2017-07-20 ENCOUNTER — Emergency Department (HOSPITAL_COMMUNITY): Payer: Self-pay

## 2017-07-20 ENCOUNTER — Emergency Department (HOSPITAL_COMMUNITY)
Admission: EM | Admit: 2017-07-20 | Discharge: 2017-07-20 | Disposition: A | Discharge status: Home / Self Care | Payer: Self-pay | Attending: Emergency Medicine | Admitting: Emergency Medicine

## 2017-07-20 ENCOUNTER — Encounter (HOSPITAL_COMMUNITY): Payer: Self-pay

## 2017-07-20 DIAGNOSIS — Z79899 Other long term (current) drug therapy: Secondary | ICD-10-CM | POA: Insufficient documentation

## 2017-07-20 DIAGNOSIS — M7661 Achilles tendinitis, right leg: Secondary | ICD-10-CM | POA: Insufficient documentation

## 2017-07-20 DIAGNOSIS — Z87891 Personal history of nicotine dependence: Secondary | ICD-10-CM | POA: Insufficient documentation

## 2017-07-20 DIAGNOSIS — J45909 Unspecified asthma, uncomplicated: Secondary | ICD-10-CM | POA: Insufficient documentation

## 2017-07-20 MED ORDER — MELOXICAM 15 MG PO TABS: 15.0000 mg | ORAL_TABLET | Freq: Every day | ORAL | 0 refills | Status: DC

## 2017-07-20 NOTE — ED Triage Notes (Signed)
Pt c/o right foot pain, more painful with ambulation X1 week. Denies any injury, does work on his feet.

## 2017-07-20 NOTE — Discharge Instructions (Addendum)
I think your pain is caused by Achilles bursitis versus tendinitis.  Avoid boots that rub on the back of your heel.  Ice and elevate your ankle several times a day.  Take Mobic as prescribed for pain and inflammation.  If no improvement, follow-up with family doctor.

## 2017-07-20 NOTE — ED Provider Notes (Signed)
[facility] Provider Note   CSN: [id number] Arrival date & time: 07/20/17  1709     History   Chief Complaint Chief Complaint  Patient presents with  . Foot Pain    HPI Shawn Davenport is a 37 y.o. male.  HPI  Shawn PopDavid M Davenport is a 37 y.o. male complaining of pain to the right ankle.  Patient states that he first noticed pain about a week ago.  Since then the pain has gotten much worse.  He denies any injuries.  He states that pain is worse when he is walking and especially bad when he is wearing his boots at work.  He states the boots are rubbing on the back of his heel where the pain is.  Denies any fever or chills.  No history of the same.  No prior ankle or foot problems.  No medications taken prior to coming in.   Past Medical History:  Diagnosis Date  . Asthma   . GERD (gastroesophageal reflux disease)    tums  . History of chicken pox    [age]yrs old  . Perennial allergic rhinitis    ragweed, dogs, cats, mold    Patient Active Problem List   Diagnosis Date Noted  . 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 Streptococcal sore throat 11/07/2015  . Asthma with acute exacerbation 03/25/2015  . Elevated blood pressure 07/04/2013  . Obesity 07/04/2013  . Perennial allergic rhinitis   . Asthma     Past Surgical History:  Procedure Laterality Date  . FOOT SURGERY Right   . HAND SURGERY Left 2013   pins for broken 5th MCP  . WRIST SURGERY Left 1999        Home Medications    Prior to Admission medications   Medication Sig Start Date End Date Taking? Authorizing Provider  albuterol (PROVENTIL HFA;VENTOLIN HFA) 108 (90 Base) MCG/ACT inhaler Inhale 1-2 puffs into the lungs every 6 (six) hours as needed for wheezing or shortness of breath. 01/11/17   Khatri, Hina, PA-C  fluticasone (FLONASE) 50 MCG/ACT nasal spray Place 1 spray into both nostrils 2 (two) times daily as needed for allergies or  rhinitis. 03/25/15   Bobbitt, Heywood Ilesalph Carter, MD  fluticasone-salmeterol (ADVAIR HFA) [id number] MCG/ACT inhaler Inhale 2 puffs into the lungs 2 (two) times daily. 01/11/17   Khatri, Hina, PA-C  ibuprofen (ADVIL,MOTRIN) 800 MG tablet Take 1 tablet (800 mg total) by mouth 3 (three) times daily. Patient not taking: Reported on 03/07/2016 12/29/15   Excell SeltzerBedsole, Amy E, MD  loratadine (CLARITIN) 10 MG tablet Take 10 mg by mouth daily.    [provider]  NON FORMULARY Place 1 drop into both eyes daily as needed.    [provider]  predniSONE (DELTASONE) 20 MG tablet 2 tabs po daily x 4 days 03/07/16   Rolan BuccoBelfi, Melanie, MD    Family History Family History  Problem Relation Age of Onset  . Cancer Mother 935       breast, bone  . Hyperlipidemia Father   . Hypertension Father   . Diabetes Father   . Diabetes Paternal Uncle   . Hyperlipidemia Paternal Grandfather   . Hypertension Paternal Grandfather     Social History Social History   Tobacco Use  . Smoking status: Former Smoker    Last attempt to quit: 07/04/2008    Years since quitting: 9.0  . Smokeless tobacco: Never Used  Substance Use Topics  . Alcohol use: Yes  Alcohol/week: 0.0 oz    Comment: once a week  . Drug use: No     Allergies   Other and Penicillins   Review of Systems Review of Systems  Constitutional: Negative for chills and fever.  Respiratory: Negative for cough, chest tightness and shortness of breath.   Cardiovascular: Negative for chest pain, palpitations and leg swelling.  Musculoskeletal: Positive for arthralgias and joint swelling.  Skin: Negative for rash.  Allergic/Immunologic: Negative for immunocompromised state.  Neurological: Negative for weakness, numbness and headaches.  All other systems reviewed and are negative.    Physical Exam Updated Vital Signs BP (!) 165/100   Pulse 80   Temp 99.1 F (37.3 C) (Oral)   Resp 18   Ht 5\' 10"  (1.778 m)   Wt 113.4 kg (250 lb)   SpO2 99%    BMI 35.87 kg/m   Physical Exam  Constitutional: He appears well-developed and well-nourished. No distress.  Eyes: Conjunctivae are normal.  Neck: Neck supple.  Cardiovascular: Normal rate.  Pulmonary/Chest: No respiratory distress.  Abdominal: He exhibits no distension.  Musculoskeletal:  Tenderness to palpation over posterior calcaneus at the attachment of the Achilles tendon.  Achilles tendon is otherwise nontender and is intact.  Pain with dorsiflexion and plantarflexion of the ankle.  No pain with inversion or eversion.  No tenderness over medial lateral malleoli.  No foot is otherwise.  There is mild erythema and inflammation over the insertion point of Achilles tendon  Skin: Skin is warm and dry.  Nursing note and vitals reviewed.    ED Treatments / Results  Labs (all labs ordered are listed, but only abnormal results are displayed) Labs Reviewed - No data to display  EKG None  Radiology Dg Ankle Complete Right  Result Date: 07/20/2017 CLINICAL DATA:  Pain over the last week. EXAM: RIGHT ANKLE - COMPLETE 3+ VIEW COMPARISON:  None. FINDINGS: No evidence of soft tissue swelling or joint effusion. No ankle joint degenerative changes. No fracture or subluxation. IMPRESSION: Negative radiographs. Electronically Signed   By: Paulina Fusi M.D.   On: 07/20/2017 18:46    Procedures Procedures (including critical care time)  Medications Ordered in ED Medications - No data to display   Initial Impression / Assessment and Plan / ED Course  I have reviewed the triage vital signs and the nursing notes.  Pertinent labs & imaging results that were available during my care of the patient were reviewed by me and considered in my medical decision making (see chart for details).     Patient with atraumatic pain at the posterior calcaneus, at insertion point of Achilles tendon.  Exam most consistent with Achilles bursitis.  Discussed wearing shoes that do not rub on the back of his  heel.  Discussed ice, elevation, NSAIDs, follow-up with family doctor.  X-ray negative.  Vitals:   07/20/17 1743 07/20/17 1744 07/20/17 1746  BP:   (!) 165/100  Pulse: 80    Resp: 18    Temp: 99.1 F (37.3 C)    TempSrc: Oral    SpO2: 99%    Weight:  113.4 kg (250 lb)   Height:  5\' 10"  (1.778 m)      Final Clinical Impressions(s) / ED Diagnoses   Final diagnoses:  Achilles bursitis of right lower extremity    ED Discharge Orders    None       Iona Coach 07/20/17 1926    Mesner, Barbara Cower, MD 07/24/17 2138

## 2017-07-30 ENCOUNTER — Other Ambulatory Visit: Payer: Self-pay

## 2017-07-30 ENCOUNTER — Ambulatory Visit (HOSPITAL_COMMUNITY)
Admission: EM | Admit: 2017-07-30 | Discharge: 2017-07-30 | Disposition: A | Discharge status: Home / Self Care | Payer: Self-pay | Attending: Family Medicine | Admitting: Family Medicine

## 2017-07-30 ENCOUNTER — Encounter (HOSPITAL_COMMUNITY): Payer: Self-pay | Admitting: Emergency Medicine

## 2017-07-30 DIAGNOSIS — M7661 Achilles tendinitis, right leg: Secondary | ICD-10-CM

## 2017-07-30 MED ORDER — METHYLPREDNISOLONE 4 MG PO TBPK: ORAL_TABLET | ORAL | 0 refills | Status: DC

## 2017-07-30 NOTE — ED Triage Notes (Signed)
2-3 weeks ago started noticing right heel pain that gradually worsened.  Seen in the ed, with xrays.  Pain has worsened to the point of inability to walk.  Movement of foot is too painful to make steps.

## 2017-07-30 NOTE — ED Provider Notes (Signed)
[facility]    CSN: [id number] Arrival date & time: 07/30/17  1221     History   Chief Complaint Chief Complaint  Patient presents with  . Foot Pain    HPI Shawn Davenport is a 37 y.o. male.   HPI  Patient is here for emergency room follow-up.  He was seen in the emergency room on July 20, 2017 for ankle pain.  He was found to have Achilles bursitis.  He was treated with meloxicam.  He was told to use ice.  He was still on the meloxicam.  He is having increased pain.  He is having difficulty with ambulation.  Now he can hardly bear weight on the leg.  He is here today on crutches.  His ankle x-rays reviewed and discussed with him.  No fracture, moderate posterior heel spur that I tell patient is of no consequence with this injury.  Past Medical History:  Diagnosis Date  . Asthma   . GERD (gastroesophageal reflux disease)    tums  . History of chicken pox    37yrs old  . Perennial allergic rhinitis    ragweed, dogs, cats, mold    Patient Active Problem List   Diagnosis Date Noted  . 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 Streptococcal sore throat 11/07/2015  . Asthma with acute exacerbation 03/25/2015  . Elevated blood pressure 07/04/2013  . Obesity 07/04/2013  . Perennial allergic rhinitis   . Asthma     Past Surgical History:  Procedure Laterality Date  . FOOT SURGERY Right   . HAND SURGERY Left 2013   pins for broken 5th MCP  . WRIST SURGERY Left 1999       Home Medications    Prior to Admission medications   Medication Sig Start Date End Date Taking? Authorizing Provider  Mometasone Furo-Formoterol Fum (DULERA IN) Inhale into the lungs.   Yes [provider]  albuterol (PROVENTIL HFA;VENTOLIN HFA) 108 (90 Base) MCG/ACT inhaler Inhale 1-2 puffs into the lungs every 6 (six) hours as needed for wheezing or shortness of breath. 01/11/17   Khatri, Hina, PA-C  fluticasone (FLONASE) 50 MCG/ACT nasal spray  Place 1 spray into both nostrils 2 (two) times daily as needed for allergies or rhinitis. 03/25/15   Bobbitt, Heywood Iles, MD  fluticasone-salmeterol (ADVAIR HFA) 409-81 MCG/ACT inhaler Inhale 2 puffs into the lungs 2 (two) times daily. 01/11/17   Khatri, Hina, PA-C  loratadine (CLARITIN) 10 MG tablet Take 10 mg by mouth daily.    [provider]  methylPREDNISolone (MEDROL DOSEPAK) 4 MG TBPK tablet tad 07/30/17   Eustace Moore, MD  NON FORMULARY Place 1 drop into both eyes daily as needed.    [provider]  predniSONE (DELTASONE) 20 MG tablet 2 tabs po daily x 4 days 03/07/16   Rolan Bucco, MD    Family History Family History  Problem Relation Age of Onset  . Cancer Mother 39       breast, bone  . Hyperlipidemia Father   . Hypertension Father   . Diabetes Father   . Diabetes Paternal Uncle   . Hyperlipidemia Paternal Grandfather   . Hypertension Paternal Grandfather     Social History Social History   Tobacco Use  . Smoking status: Former Smoker    Last attempt to quit: 07/04/2008    Years since quitting: 9.0  . Smokeless tobacco: Never Used  Substance Use Topics  . Alcohol use: Yes    Alcohol/week: 0.0  oz    Comment: once a week  . Drug use: No     Allergies   Other and Penicillins   Review of Systems Review of Systems  Constitutional: Negative for chills and fever.  HENT: Negative for ear pain and sore throat.   Eyes: Negative for pain and visual disturbance.  Respiratory: Negative for cough and shortness of breath.   Cardiovascular: Negative for chest pain and palpitations.  Gastrointestinal: Negative for abdominal pain and vomiting.  Genitourinary: Negative for dysuria and hematuria.  Musculoskeletal: Positive for gait problem. Negative for arthralgias and back pain.  Skin: Negative for color change and rash.  Neurological: Negative for seizures and syncope.  All other systems reviewed and are negative.    Physical Exam Triage  Vital Signs ED Triage Vitals [07/30/17 1348]  Enc Vitals Group     BP      Pulse      Resp      Temp      Temp src      SpO2      Weight      Height      Head Circumference      Peak Flow      Pain Score 8     Pain Loc      Pain Edu?      Excl. in GC?    No data found.  Updated Vital Signs There were no vitals taken for this visit.      Physical Exam  Constitutional: He appears well-developed and well-nourished. He appears distressed.  Moderately uncomfortable  HENT:  Head: Normocephalic and atraumatic.  Eyes: Conjunctivae are normal.  Neck: Neck supple.  Cardiovascular: Normal rate and regular rhythm.  No murmur heard. Pulmonary/Chest: Effort normal and breath sounds normal. No respiratory distress.  Abdominal: There is no tenderness.  Musculoskeletal: Normal range of motion. He exhibits no edema.  Normal strength sensation range of motion reflexes in upper extremities and right lower extremity.  Left lower extremity is deficiencies around the ankle.  Full extension.  Limited flexion to 90 degrees.  Full internal and external rotation.  Tenderness to palpation of the lower Achilles tendon.  Achilles tendon palpates intact.  Patient can barely lift his heel off the floor from a standing position.  No evidence of Achilles tendon rupture.  Gastrocnemius muscles are symmetric  Lymphadenopathy:    He has no cervical adenopathy.  Neurological: He is alert.  Skin: Skin is warm and dry.  Psychiatric: He has a normal mood and affect.  Nursing note and vitals reviewed.    UC Treatments / Results    Initial Impression / Assessment and Plan / UC Course  I have reviewed the triage vital signs and the nursing notes.  Pertinent labs & imaging results that were available during my care of the patient were reviewed by me and considered in my medical decision making (see chart for details).     Discussed with patient that he needs to have his ankle immobilized.  His Achilles  tendinitis.  The bursa is not specifically tender today.  He needs anti-inflammatory stronger than Mobic.  He is to stop the Mobic and use prednisone.  He needs to follow-up with an orthopedic ASAP next week.  He may need casting.  Note is given to him for work. The patient questions whether this is work-related.  I explained to him that although he does a lot of standing walking and climbing on his job, most of his shift,  without specific accident or injury this is not a common overuse injury seen in the workplace.  He states that he does not do much walking at home therefore it must be work-related.  I told him he can contact his work [employer] and report the injury if he desires.  Again causation is difficult to state.  Final Clinical Impressions(s) / UC Diagnoses   Final diagnoses:  Achilles tendinitis of right lower extremity     Discharge Instructions     Stop taking Mobic Take the methylprednisolone as directed.  It comes in a package.  Take all of day 1 today. Use ice 20 minutes every couple of hours throughout the day. Weight-bear as tolerated in boot.  Do not try to walk without the walking boot.  You may take it up to sleep. Refer to orthopedic one day next week.  Dr. Berton Bon orthopedic is the foot and ankle specialist, however you may see any doctor. Off work until cleared by orthopedic   ED Prescriptions    Medication Sig Dispense Auth. Provider   methylPREDNISolone (MEDROL DOSEPAK) 4 MG TBPK tablet tad 21 tablet Eustace Moore, MD     Controlled Substance Prescriptions [place] Controlled Substance Registry consulted? Not Applicable   Eustace Moore, MD 07/30/17 2140

## 2017-07-30 NOTE — Discharge Instructions (Signed)
Stop taking Mobic Take the methylprednisolone as directed.  It comes in a package.  Take all of day 1 today. Use ice 20 minutes every couple of hours throughout the day. Weight-bear as tolerated in boot.  Do not try to walk without the walking boot.  You may take it up to sleep. Refer to orthopedic one day next week.  Dr. Berton Bon orthopedic is the foot and ankle specialist, however you may see any doctor. Off work until cleared by orthopedic

## 2017-08-15 ENCOUNTER — Encounter: Payer: Self-pay | Admitting: Physical Therapy

## 2017-08-15 ENCOUNTER — Ambulatory Visit: Payer: Self-pay | Attending: Orthopedic Surgery | Admitting: Physical Therapy

## 2017-08-15 ENCOUNTER — Other Ambulatory Visit: Payer: Self-pay

## 2017-08-15 DIAGNOSIS — M25571 Pain in right ankle and joints of right foot: Secondary | ICD-10-CM | POA: Insufficient documentation

## 2017-08-15 DIAGNOSIS — R2689 Other abnormalities of gait and mobility: Secondary | ICD-10-CM | POA: Insufficient documentation

## 2017-08-15 DIAGNOSIS — M62831 Muscle spasm of calf: Secondary | ICD-10-CM | POA: Insufficient documentation

## 2017-08-15 DIAGNOSIS — G8929 Other chronic pain: Secondary | ICD-10-CM | POA: Insufficient documentation

## 2017-08-15 MED FILL — !ADVAIR HFA 115-21 MCG INHA: 115-21 | 30 days supply | Qty: 12 | Fill #0

## 2017-08-15 MED FILL — !VENTOLIN HFA INHALER: 108 (90 BAS | 25 days supply | Qty: 18 | Fill #0

## 2017-08-15 NOTE — Therapy (Signed)
[facility] [address] [place], [place], [address] Phone: [phone]   Fax:  [phone]  Physical Therapy Evaluation  Patient Details  Name: Shawn Davenport MRN: [id number] Date of Birth: December 19, 1980 Referring Provider: Toni Arthurs MD   Encounter Date: 08/15/2017  PT End of Session - 08/15/17 1459    Visit Number  1    Number of Visits  13    Date for PT Re-Evaluation  09/26/17    PT Start Time  1420    PT Stop Time  1515    PT Time Calculation (min)  55 min    Activity Tolerance  Patient tolerated treatment well    Behavior During Therapy  [facility] for tasks assessed/performed       Past Medical History:  Diagnosis Date  . Asthma   . GERD (gastroesophageal reflux disease)    tums  . History of chicken pox    37yrs old  . Perennial allergic rhinitis    ragweed, dogs, cats, mold    Past Surgical History:  Procedure Laterality Date  . FOOT SURGERY Right   . HAND SURGERY Left 2013   pins for broken 5th MCP  . WRIST SURGERY Left 1999    There were no vitals filed for this visit.   Subjective Assessment - 08/15/17 1423    Subjective  pt is a 37 y.o m with CC of R ankle pain that started about 2 months ago with gradual onset with progressive worsening with no specific MOI. went to urgent care and was given a boot and reports minimal improvement. Pain never seems to go away, worse with walking and pain stays the same.     Limitations  Standing;Walking    How long can you sit comfortably?  unlimited    How long can you stand comfortably?  unlimited    How long can you walk comfortably?  20-30 min    Diagnostic tests  x-ray    Patient Stated Goals  to walk normal, and decrease pain,     Currently in Pain?  Yes    Pain Score  0-No pain at worst 8/10.     Pain Location  Ankle    Pain Orientation  Right;Posterior    Pain Descriptors / Indicators  Sharp;Tightness;Stabbing    Pain Type  Chronic pain    Pain Onset  More  than a month ago    Pain Frequency  Constant    Aggravating Factors   walking, standing    Pain Relieving Factors  avoiding pushing off on toes,          [facility] PT Assessment - 08/15/17 1423      Assessment   Medical Diagnosis  R ankle achilles tendintits    Referring Provider  Toni Arthurs MD    Onset Date/Surgical Date  -- 2 months    Hand Dominance  Left    Next MD Visit  make one PRN    Prior Therapy  yes      Precautions   Precautions  None      Restrictions   Weight Bearing Restrictions  No      Balance Screen   Has the patient fallen in the past 6 months  No    Has the patient had a decrease in activity level because of a fear of falling?   No    Is the patient reluctant to leave their home because of a fear of falling?   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  lifting/ carrying, getting onto and off of equipement       Cognition   Overall Cognitive Status  Within Functional Limits for tasks assessed      Observation/Other Assessments   Focus on Therapeutic Outcomes (FOTO)   55% limited predicted 33% limited      Posture/Postural Control   Posture/Postural Control  Postural limitations    Postural Limitations  Forward head;Rounded Shoulders      ROM / Strength   AROM / PROM / Strength  AROM;PROM;Strength      AROM   AROM Assessment Site  Ankle    Right/Left Ankle  Right;Left    Right Ankle Dorsiflexion  0 from 90 degree pain during testing    Right Ankle Plantar Flexion  64    Right Ankle Inversion  14    Right Ankle Eversion  12    Left Ankle Dorsiflexion  6     Left Ankle Plantar Flexion  66    Left Ankle Inversion  33    Left Ankle Eversion  12      PROM   PROM Assessment Site  Ankle    Right/Left Ankle  Right    Right Ankle Dorsiflexion  7      Strength   Strength Assessment Site  Ankle    Right/Left Ankle  Right;Left    Right Ankle Dorsiflexion  4+/5 within available ROM    Right Ankle Plantar Flexion  4+/5    Right Ankle Inversion  4+/5 pain during testing    Right Ankle Eversion  4+/5 pain during testing    Left Ankle Dorsiflexion  5/5    Left Ankle Plantar Flexion  5/5    Left Ankle Inversion  5/5    Left Ankle Eversion  5/5      Palpation   Palpation comment  TTP noted in the gastroc/ soleus as well as signigicant soreness in the achilles tendon and enlargement of the posterior calcaneal tubercle      Special Tests    Special Tests  Ankle/Foot Special Tests    Ankle/Foot Special Tests   Thompson's Test      Thompson's Test   Findings  Negative      Ambulation/Gait   Ambulation/Gait  Yes    Gait Pattern  Antalgic;Trendelenburg;Decreased stride length;Decreased step length - left;Decreased stance time - right;Step-to pattern                Objective measurements completed on examination: See above findings.      [facility] Adult PT Treatment/Exercise - 08/15/17 1423      Manual Therapy   Manual Therapy  Soft tissue mobilization    Manual therapy comments  skilled palpation and monitoring throughout TPDn    Soft tissue mobilization  IASTM along R gastroc/ soleus      Ankle Exercises: Stretches   Soleus Stretch  2 reps;30 seconds    Gastroc Stretch  2 reps;30 seconds      Ankle Exercises: Seated   Towel Crunch  2 reps    Other Seated Ankle Exercises  toe yoga 1 x 10 holding ea. for 1              PT Education - 08/15/17 1457    Education provided  Yes    Education Details  evaluation findings, POC, goals, HEP with proper form/ rationale. Muscle anatomy and referral patterns. What TPDn is, benefits,  what to expect and after care"    Person(s) Educated  Patient    Methods  Explanation;Verbal cues;Handout    Comprehension  Verbalized understanding;Verbal cues required       PT Short Term Goals - 08/15/17 1528      PT SHORT TERM GOAL #1   Title  pt to be I with inital HEP    Time  3    Period  Weeks    Status  New    Target Date  09/05/17      PT SHORT TERM GOAL #2   Title  pt to demo techniques to reduce inflammation and edeam via RICE and HEP     Time  3    Period  Weeks    Status  New    Target Date  09/05/17        PT Long Term Goals - 08/15/17 1529      PT LONG TERM GOAL #1   Title  pt to increase R ankle DF to >/= 8 degrees to promote functional and efficient gait pattern with </= 1/10 pain     Time  6    Period  Weeks    Status  New    Target Date  09/26/17      PT LONG TERM GOAL #2   Title  pt to increase R ankle strength to >/= 5/5 in all planes for ankle stability with prolonged standing/ walking    Time  6    Period  Weeks    Status  New    Target Date  09/26/17      PT LONG TERM GOAL #3   Title  pt to be able to walk/ stand >/= 60 min with </= 1/10 pain for endurance related to work activities     Time  6    Period  Weeks    Status  New    Target Date  09/26/17      PT LONG TERM GOAL #4   Title  increase FOTO score to </=33% limited to demo improvement in function     Time  6    Period  Weeks    Status  New    Target Date  09/26/17      PT LONG TERM GOAL #5   Title  pt to be I with all HEp given as of last visit to maintain and progress current level of function     Time  6    Period  Weeks    Status  New    Target Date  09/26/17             Plan - 08/15/17 1521    Clinical Impression Statement  pt presents to OPPT with CC of R achilles pain starting non-traumtically 2 months ago. pt demonstates limited Df in the R and pain during MMT of the R ankle. TTP noted in the gastroc/ soleus as well as signigicant soreness in the achilles  tendon and  enlargement of the posterior calcaneal tubercle. Educated and performed TPDN on the gastrocnemus / soleus followed wiht IASTM techniques which he reported relief of pain and tightness. He would benefit from physical therapy to improve ankle mobility and strength, promote proper gait and return to PLOF by addressing the deficits listed.     Clinical Presentation  Stable    Clinical Decision Making  Low    Rehab Potential  Good    PT Frequency  2x / week    PT Duration  6 weeks    PT Treatment/Interventions  ADLs/Self Care Home Management;Electrical Stimulation;Iontophoresis /ml Dexamethasone;Moist Heat;Therapeutic exercise;Therapeutic activities;Ultrasound;Dry needling;Patient/family education;Passive range of motion;Manual techniques;Taping    PT Next Visit Plan  review/ update HEP, assess response to DN, STW for calfs, eccentrics,     PT Home Exercise Plan  plantarflexors eccentrics with theraband, toe yoga, towel scrunches. gastroc/soleus stretching    Consulted and Agree with Plan of Care  Patient       Patient will benefit from skilled therapeutic intervention in order to improve the following deficits and impairments:  Abnormal gait, Pain, Increased fascial restricitons, Decreased endurance, Improper body mechanics, Postural dysfunction, Decreased activity tolerance, Increased muscle spasms, Decreased range of motion  Visit Diagnosis: Chronic pain of right ankle  Other abnormalities of gait and mobility  Muscle spasm of calf     Problem List Patient Active Problem List   Diagnosis Date Noted  . 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 Streptococcal sore throat 11/07/2015  . Asthma with acute exacerbation 03/25/2015  . Elevated blood pressure 07/04/2013  . Obesity 07/04/2013  . Perennial allergic rhinitis   . Asthma    Kristoffer Leamon PT, DPT, LAT, ATC  08/15/17  3:34 PM      [facility] [address] [place], [place], [address] Phone: [phone]   Fax:  [phone]  Name: RENJI BERWICK MRN: [id number] Date of Birth: August 29, 1980

## 2017-08-28 IMAGING — DX DG CHEST 2V
2 series · 2 of 2 positions shown · non-contrast
Comparison: Two-view chest x-ray 11/22/2010

CLINICAL DATA: Chest pain and shortness of breath.

EXAM:
CHEST  2 VIEW

[chest pa]
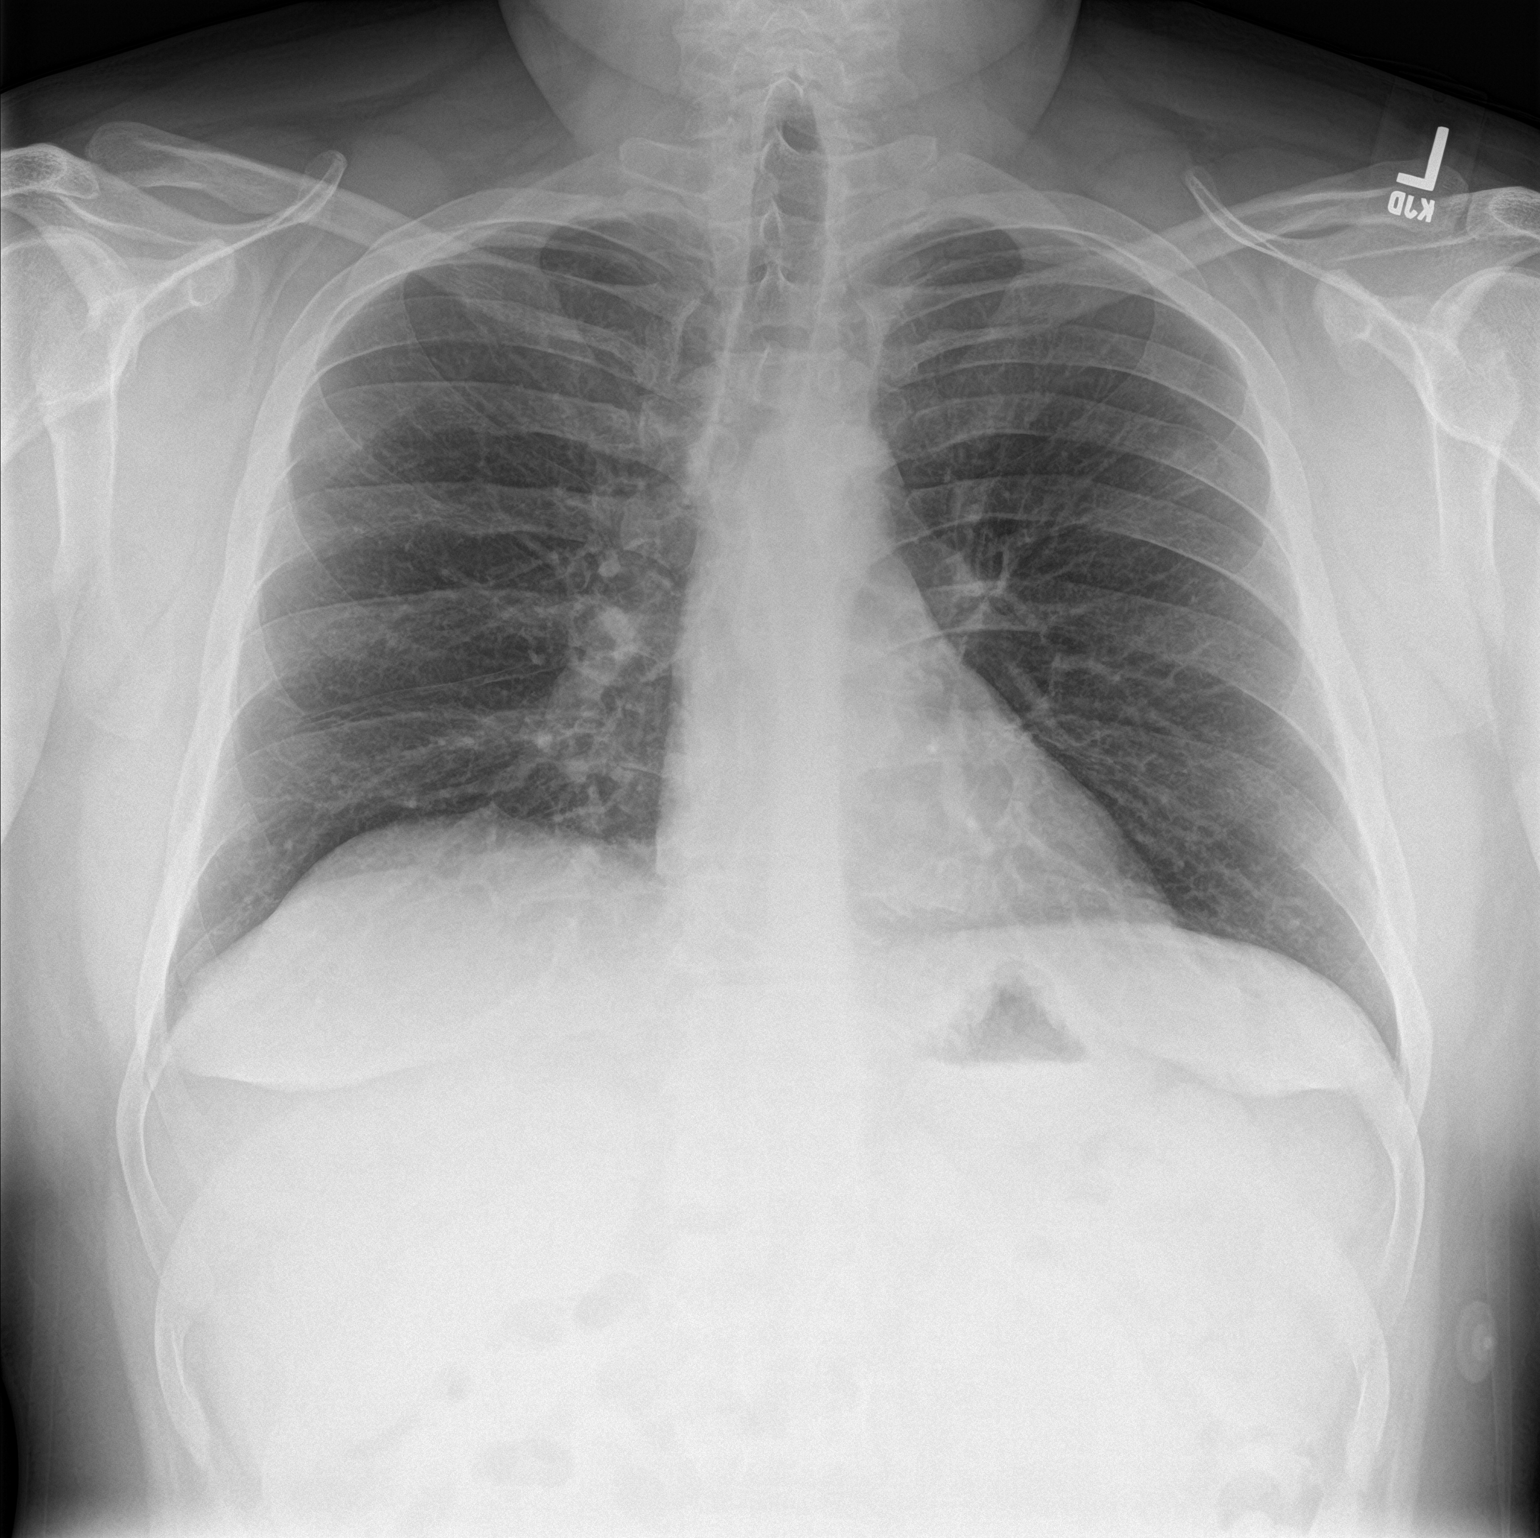

[chest lat]
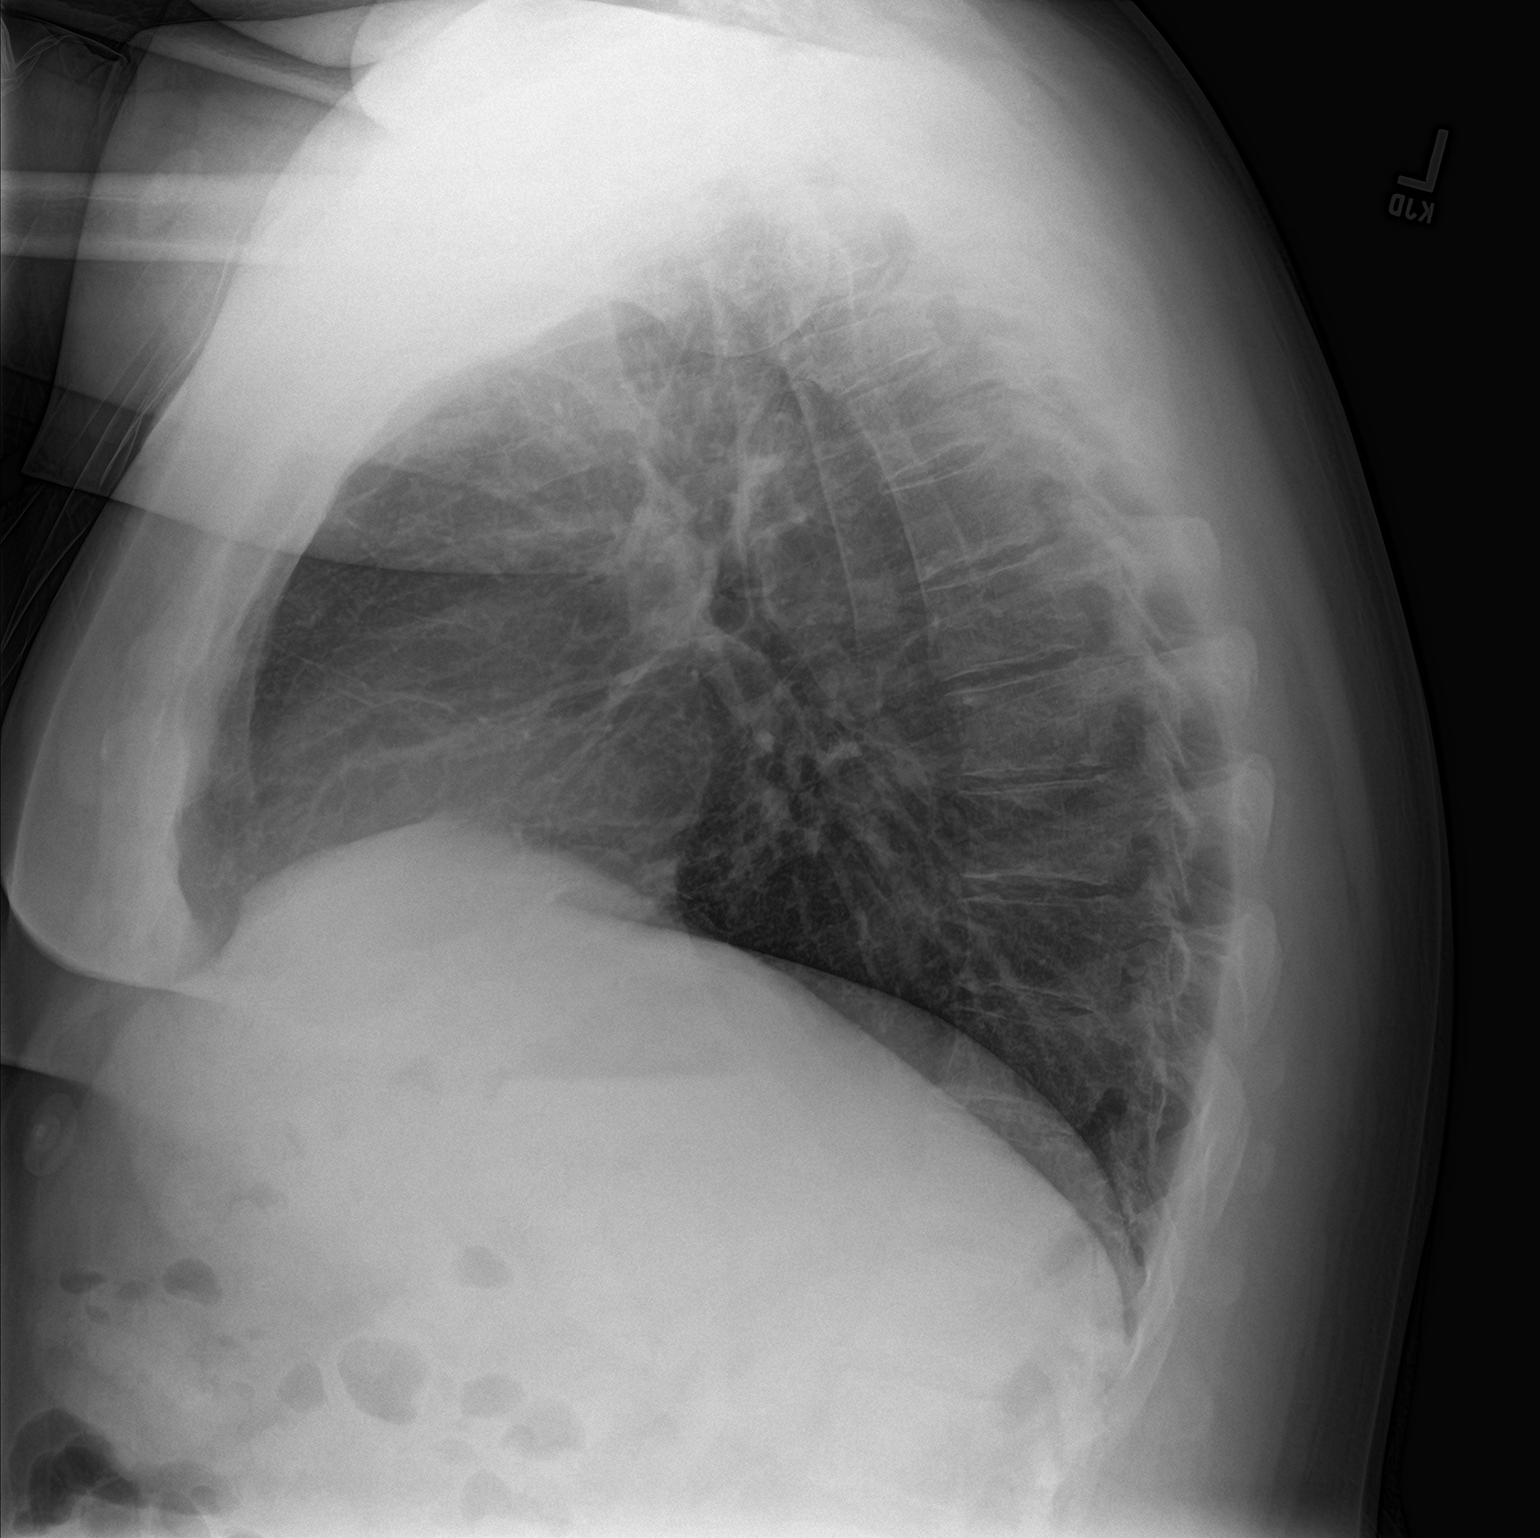

[2 of 2 positions shown; findings below may reference images not displayed]

FINDINGS: The heart size and mediastinal contours are within normal limits.
Both lungs are clear. The visualized skeletal structures are
unremarkable.
IMPRESSION: Negative two view chest x-ray.

## 2017-08-29 ENCOUNTER — Encounter: Payer: Self-pay | Admitting: Physical Therapy

## 2017-08-29 ENCOUNTER — Ambulatory Visit: Payer: Self-pay | Attending: Orthopedic Surgery | Admitting: Physical Therapy

## 2017-08-29 DIAGNOSIS — M62831 Muscle spasm of calf: Secondary | ICD-10-CM | POA: Insufficient documentation

## 2017-08-29 DIAGNOSIS — R2689 Other abnormalities of gait and mobility: Secondary | ICD-10-CM | POA: Insufficient documentation

## 2017-08-29 DIAGNOSIS — M25571 Pain in right ankle and joints of right foot: Secondary | ICD-10-CM | POA: Insufficient documentation

## 2017-08-29 DIAGNOSIS — G8929 Other chronic pain: Secondary | ICD-10-CM | POA: Insufficient documentation

## 2017-08-29 NOTE — Therapy (Addendum)
[place], [place], [address] Phone: [phone]   Fax:  [phone]  Physical Therapy Treatment / Discharge Summary  Patient Details  Name: Shawn Davenport MRN: [id number] Date of Birth: 1980/08/15 Referring Provider: Wylene Simmer MD   Encounter Date: 08/29/2017  PT End of Session - 08/29/17 1410    Visit Number  2    Number of Visits  13    Date for PT Re-Evaluation  09/26/17    PT Start Time  4132    PT Stop Time  1454    PT Time Calculation (min)  42 min    Activity Tolerance  Patient tolerated treatment well    Behavior During Therapy  [facility] for tasks assessed/performed       Past Medical History:  Diagnosis Date  . Asthma   . GERD (gastroesophageal reflux disease)    tums  . History of chicken pox    37yr old  . Perennial allergic rhinitis    ragweed, dogs, cats, mold    Past Surgical History:  Procedure Laterality Date  . FOOT SURGERY Right   . HAND SURGERY Left 2013   pins for broken 5th MCP  . WRIST SURGERY Left 1999    There were no vitals filed for this visit.  Subjective Assessment - 08/29/17 1409    Subjective  "I do feel like I am doing better today, some swelling but doing better"     Currently in Pain?  No/denies    Pain Score  0-No pain at worse 2/10     Pain Type  Chronic pain    Pain Onset  More than a month ago    Aggravating Factors   stretching,     Pain Relieving Factors  icing                       [facility] Adult PT Treatment/Exercise - 08/29/17 1422      Manual Therapy   Manual Therapy  Other (comment)    Manual therapy comments  skilled palpation and monitoring throughout TPDn    Soft tissue mobilization  IASTM along R gastroc/ soleus    Other Manual Therapy  Tack and stretch of calf and active release techniques      Ankle Exercises: Stretches   Soleus Stretch  2 reps;30 seconds slant board    Gastroc Stretch  2 reps;30 seconds slant board       Ankle Exercises: Seated   Towel Crunch  Weights;3 reps 2 1/2 #    Other Seated Ankle Exercises  toe yoga 1 x 10 holding ea. position for 5 sec eac      Ankle Exercises: Aerobic   Stationary Bike  L4 x 5 min      Ankle Exercises: Standing   Other Standing Ankle Exercises  heel raise raise up with both and controlled eccentric lowering with RLE from EOB 2 x 12       Trigger Point Dry Needling - 08/29/17 1420    Consent Given?  Yes    Education Handout Provided  Yes    Muscles Treated Lower Body  Gastrocnemius    Gastrocnemius Response  Twitch response elicited;Palpable increased muscle length R         Balance Exercises - 08/29/17 1429      Balance Exercises: Standing   SLS  --        PT Education - 08/29/17 1456    Education  provided  Yes    Education Details  reviewed previously provided HEP, and updated for bil heel raised from edge of step 2 x 10 with controlled eccentrics.     Person(s) Educated  Patient    Methods  Explanation;Verbal cues    Comprehension  Verbalized understanding;Verbal cues required       PT Short Term Goals - 08/15/17 1528      PT SHORT TERM GOAL #1   Title  pt to be I with inital HEP    Time  3    Period  Weeks    Status  New    Target Date  09/05/17      PT SHORT TERM GOAL #2   Title  pt to demo techniques to reduce inflammation and edeam via RICE and HEP     Time  3    Period  Weeks    Status  New    Target Date  09/05/17        PT Long Term Goals - 08/15/17 1529      PT LONG TERM GOAL #1   Title  pt to increase R ankle DF to >/= 8 degrees to promote functional and efficient gait pattern with </= 1/10 pain     Time  6    Period  Weeks    Status  New    Target Date  09/26/17      PT LONG TERM GOAL #2   Title  pt to increase R ankle strength to >/= 5/5 in all planes for ankle stability with prolonged standing/ walking    Time  6    Period  Weeks    Status  New    Target Date  09/26/17      PT LONG TERM GOAL #3    Title  pt to be able to walk/ stand >/= 60 min with </= 1/10 pain for endurance related to work activities     Time  6    Period  Weeks    Status  New    Target Date  09/26/17      PT LONG TERM GOAL #4   Title  increase FOTO score to </=33% limited to demo improvement in function     Time  6    Period  Weeks    Status  New    Target Date  09/26/17      PT LONG TERM GOAL #5   Title  pt to be I with all HEp given as of last visit to maintain and progress current level of function     Time  6    Period  Weeks    Status  New    Target Date  09/26/17            Plan - 08/29/17 1410    Clinical Impression Statement  Mr. Espina has made great progress since the last session reporting no pain today and max pain at 2/10. He has been compliant with his HEP. continued TPDN for the R gastroc followed with IASTM techniques. Reviewed preivously provided HEP. continued ankle strengthening which he peroformed well. updated HEP for bil heel raised from edge of step. end of session he reported no pain.     Rehab Potential  Good    PT Next Visit Plan  update HEP, balance assess response to DN, STW for calfs, eccentrics, is he able to progress to SL eccentrics? could do leg press, single leg balance, if doing  well trial witout PT for 1 week.     PT Home Exercise Plan  plantarflexors eccentrics with theraband, toe yoga, towel scrunches. gastroc/soleus stretching, bil heel raise with eccentric lower.     Consulted and Agree with Plan of Care  Patient       Patient will benefit from skilled therapeutic intervention in order to improve the following deficits and impairments:  Abnormal gait, Pain, Increased fascial restricitons, Decreased endurance, Improper body mechanics, Postural dysfunction, Decreased activity tolerance, Increased muscle spasms, Decreased range of motion  Visit Diagnosis: Chronic pain of right ankle  Other abnormalities of gait and mobility  Muscle spasm of  calf     Problem List Patient Active Problem List   Diagnosis Date Noted  . 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 Streptococcal sore throat 11/07/2015  . Asthma with acute exacerbation 03/25/2015  . Elevated blood pressure 07/04/2013  . Obesity 07/04/2013  . Perennial allergic rhinitis   . Asthma    Riya Huxford PT, DPT, LAT, ATC  08/29/17  2:59 PM      [facility] [address] [place], [place], [address] Phone: [phone]   Fax:  [phone]  Name: Shawn Davenport MRN: [id number] Date of Birth: Feb 18, 1981       PHYSICAL THERAPY DISCHARGE SUMMARY  Visits from Start of Care: 2  Current functional level related to goals / functional outcomes: See goals   Remaining deficits: unknown   Education / Equipment: HEP, theraband, posture, anatomy  Plan: Patient agrees to discharge.  Patient goals were not met. Patient is being discharged due to meeting the stated rehab goals.  ?????         Talal Fritchman PT, DPT, LAT, ATC  09/19/17  2:41 PM

## 2017-08-31 ENCOUNTER — Ambulatory Visit: Payer: Self-pay | Admitting: Physical Therapy

## 2017-09-04 ENCOUNTER — Telehealth: Payer: Self-pay | Admitting: Physical Therapy

## 2017-09-04 ENCOUNTER — Ambulatory Visit: Payer: Self-pay | Admitting: Physical Therapy

## 2017-09-04 ENCOUNTER — Encounter: Payer: Self-pay | Admitting: Physical Therapy

## 2017-09-04 NOTE — Telephone Encounter (Signed)
[facility]show. [facility] as well as time/date of next scheduled appointment.  Nedra HaiKristen Fadumo Heng PT, DPT, CBIS  Supplemental Physical Therapist [facility]   Pager [phone]

## 2017-09-06 ENCOUNTER — Encounter: Payer: Self-pay | Admitting: Physical Therapy

## 2017-09-12 ENCOUNTER — Ambulatory Visit: Payer: Self-pay | Admitting: Physical Therapy

## 2017-09-14 ENCOUNTER — Encounter: Payer: Self-pay | Admitting: Physical Therapy

## 2017-09-19 ENCOUNTER — Ambulatory Visit: Payer: Self-pay | Admitting: Physical Therapy

## 2017-09-21 ENCOUNTER — Encounter: Payer: Self-pay | Admitting: Physical Therapy

## 2017-11-13 ENCOUNTER — Encounter (HOSPITAL_COMMUNITY): Payer: Self-pay | Admitting: Emergency Medicine

## 2017-11-13 ENCOUNTER — Emergency Department (HOSPITAL_COMMUNITY): Payer: Self-pay

## 2017-11-13 ENCOUNTER — Emergency Department (HOSPITAL_COMMUNITY)
Admission: EM | Admit: 2017-11-13 | Discharge: 2017-11-13 | Disposition: A | Discharge status: Home / Self Care | Payer: Self-pay | Attending: Emergency Medicine | Admitting: Emergency Medicine

## 2017-11-13 ENCOUNTER — Other Ambulatory Visit: Payer: Self-pay

## 2017-11-13 DIAGNOSIS — Z79899 Other long term (current) drug therapy: Secondary | ICD-10-CM | POA: Insufficient documentation

## 2017-11-13 DIAGNOSIS — Z87891 Personal history of nicotine dependence: Secondary | ICD-10-CM | POA: Insufficient documentation

## 2017-11-13 DIAGNOSIS — J45909 Unspecified asthma, uncomplicated: Secondary | ICD-10-CM | POA: Insufficient documentation

## 2017-11-13 DIAGNOSIS — M79672 Pain in left foot: Secondary | ICD-10-CM | POA: Insufficient documentation

## 2017-11-13 DIAGNOSIS — I10 Essential (primary) hypertension: Secondary | ICD-10-CM | POA: Insufficient documentation

## 2017-11-13 HISTORY — DX: Essential (primary) hypertension: I10

## 2017-11-13 MED ORDER — PREDNISONE 10 MG (21) PO TBPK: ORAL_TABLET | Freq: Every day | ORAL | 0 refills | Status: DC

## 2017-11-13 NOTE — Discharge Instructions (Signed)
Your foot x-ray showed mild degenerative changes at your left big toe but otherwise no evidence of fracture or dislocation.  1. Medications: Take steroid taper as prescribed and [phone] mg of Tylenol every 6 hours as needed for pain. Do not exceed 4000 mg of Tylenol daily.  Take steroid taper with food to avoid upset stomach issues.  Do not take ibuprofen, Advil, Aleve, or Motrin while you are taking the steroid taper.  You can take ibuprofen and alternate with Tylenol after the steroid taper is done. 2. Treatment: rest, ice, elevate and use brace/crutches as needed, drink plenty of fluids, gentle stretching 3. Follow Up: Please followup with orthopedics as directed or your PCP in 1 week if no improvement for discussion of your diagnoses and further evaluation after today's visit; if you do not have a primary care doctor use the resource guide provided to find one; Please return to the ER for worsening symptoms or other concerns such as worsening swelling, redness of the skin, fevers, loss of pulses, or loss of feeling

## 2017-11-13 NOTE — ED Triage Notes (Addendum)
Pt having soreness in L foot starting Thurs and then progressively getting worse. L ankle is swollen and having trouble bearing weight. Denies injury. 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

## 2017-11-13 NOTE — ED Provider Notes (Signed)
[facility] Provider Note   CSN: [id number] Arrival date & time: 11/13/17  1019     History   Chief Complaint Chief Complaint  Patient presents with  . Joint Swelling    HPI Shawn Davenport is a 37 y.o. male with history of asthma, GERD, hypertension, obesity presents for evaluation of acute onset, intermittent left foot pain for 5 days.  He denies any recent trauma or falls.  States pain at rest is negligible but he will experience occasional aching pain at rest.  Pain is sharp and stabbing with ambulation and palpation.  Pain is primarily localized to the lateral aspect of the left foot which radiates up to the left ankle.  Also noting plantarflexion and dorsiflexion of the left ankle.  Denies numbness or weakness.  No fevers, chills, nausea, vomiting.  Denies history of IV drug use, recent travel or surgeries, or prior history of DVT.  Has tried ibuprofen, Epsom salt soaks, and ice with some relief of his symptoms.  States he had a gout flare in his left great toe 3 years ago and this feels somewhat similar.  He does endorse occasional beer intake and regular red meat intake.  Has had issues with Achilles tendinitis in the right ankle but states this does not feel similarly.   The history is provided by the patient.    Past Medical History:  Diagnosis Date  . Asthma   . GERD (gastroesophageal reflux disease)    tums  . History of chicken pox    [age]yrs old  . Hypertension   . Perennial allergic rhinitis    ragweed, dogs, cats, mold    Patient Active Problem List   Diagnosis Date Noted  . 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 Streptococcal sore throat 11/07/2015  . Asthma with acute exacerbation 03/25/2015  . Elevated blood pressure 07/04/2013  . Obesity 07/04/2013  . Perennial allergic rhinitis   . Asthma     Past Surgical History:  Procedure Laterality Date  . FOOT SURGERY Right   . HAND SURGERY  Left 2013   pins for broken 5th MCP  . WRIST SURGERY Left 1999        Home Medications    Prior to Admission medications   Medication Sig Start Date End Date Taking? Authorizing Provider  albuterol (PROVENTIL HFA;VENTOLIN HFA) 108 (90 Base) MCG/ACT inhaler Inhale 1-2 puffs into the lungs every 6 (six) hours as needed for wheezing or shortness of breath. 01/11/17   Khatri, Hina, PA-C  fluticasone (FLONASE) 50 MCG/ACT nasal spray Place 1 spray into both nostrils 2 (two) times daily as needed for allergies or rhinitis. 03/25/15   Bobbitt, Heywood Ilesalph Carter, MD  fluticasone-salmeterol (ADVAIR HFA) 914-78115-21 MCG/ACT inhaler Inhale 2 puffs into the lungs 2 (two) times daily. 01/11/17   Khatri, Hina, PA-C  loratadine (CLARITIN) 10 MG tablet Take 10 mg by mouth daily.    [provider]  methylPREDNISolone (MEDROL DOSEPAK) 4 MG TBPK tablet tad Patient not taking: Reported on 08/15/2017 07/30/17   Eustace MooreNelson, Yvonne Sue, MD  Mometasone Furo-Formoterol Fum [facility](DULERA IN) Inhale into the lungs.    [provider]  NON FORMULARY Place 1 drop into both eyes daily as needed.    [provider]  predniSONE (STERAPRED UNI-PAK 21 TAB) 10 MG (21) TBPK tablet Take by mouth daily. Take 6 tabs by mouth daily  for 2 days, then 5 tabs for 2 days, then 4 tabs for 2 days, then 3  tabs for 2 days, 2 tabs for 2 days, then 1 tab by mouth daily for 2 days 11/13/17   Jeanie Sewer, PA-C    Family History Family History  Problem Relation Age of Onset  . Cancer Mother 74       breast, bone  . Hyperlipidemia Father   . Hypertension Father   . Diabetes Father   . Diabetes Paternal Uncle   . Hyperlipidemia Paternal Grandfather   . Hypertension Paternal Grandfather     Social History Social History   Tobacco Use  . Smoking status: Former Smoker    Last attempt to quit: 07/04/2008    Years since quitting: 9.3  . Smokeless tobacco: Never Used  Substance Use Topics  . Alcohol use: Yes    Alcohol/week:  0.0 standard drinks    Comment: once a week  . Drug use: No     Allergies   Other and Penicillins   Review of Systems Review of Systems  Constitutional: Negative for fever.  Musculoskeletal: Positive for arthralgias.  Neurological: Negative for weakness and numbness.  All other systems reviewed and are negative.    Physical Exam Updated Vital Signs BP (!) 143/107 (BP Location: Right Arm)   Pulse 93   Temp 98.7 F (37.1 C) (Oral)   Resp 18   SpO2 98%   Physical Exam  Constitutional: He appears well-developed and well-nourished. No distress.  HENT:  Head: Normocephalic and atraumatic.  Eyes: Conjunctivae are normal. Right eye exhibits no discharge. Left eye exhibits no discharge.  Neck: No JVD present. No tracheal deviation present.  Cardiovascular: Normal rate and intact distal pulses.  2+ DP/PT pulses bilaterally, Homans sign absent bilaterally, no lower extremity edema, no palpable cords, compartments are soft   Pulmonary/Chest: Effort normal.  Abdominal: He exhibits no distension.  Musculoskeletal: He exhibits tenderness. He exhibits no edema.  Normal active and passive range of motion of the left ankle.  5/5 strength of BLE major muscle groups.  No erythema, warmth, or induration noted.  No ligament is laxity.  Examination of the left Achilles tendon is within normal limits.  Focal tenderness overlying the distal left fifth metatarsal with no underlying crepitus or ecchymosis.  Neurological: He is alert. No sensory deficit.  Fluent speech, no facial droop, sensation intact to soft touch of bilateral lower extremities.  Ambulates with mildly antalgic gait, able to Heel Walk and Toe Walk without difficulty.  Skin: Skin is warm and dry. No erythema.  Psychiatric: He has a normal mood and affect. His behavior is normal.  Nursing note and vitals reviewed.    ED Treatments / Results  Labs (all labs ordered are listed, but only abnormal results are displayed) Labs  Reviewed - No data to display  EKG None  Radiology Dg Ankle Complete Left  Result Date: 11/13/2017 CLINICAL DATA:  Onset lateral left ankle pain and swelling 4 days ago. No known injury. EXAM: LEFT ANKLE COMPLETE - 3+ VIEW COMPARISON:  Plain films left ankle 01/22/2015. FINDINGS: No acute bony or joint abnormality is identified. Mild appearing soft tissue swelling about the lateral aspect of the ankle is noted. No joint effusion. No evidence of arthropathy. IMPRESSION: Mild appearing lateral soft tissue swelling without underlying bony or joint abnormality. Electronically Signed   By: Drusilla Kanner M.D.   On: 11/13/2017 11:23   Dg Foot Complete Left  Result Date: 11/13/2017 CLINICAL DATA:  Pain and swelling lateral foot EXAM: LEFT FOOT - COMPLETE 3+ VIEW COMPARISON:  Left  great toe 09/09/2013 FINDINGS: Mild joint space narrowing of the first MTP. No other arthropathy. No erosion. Negative for fracture IMPRESSION: Mild degenerative change first MTP Electronically Signed   By: Marlan Palauharles  Clark M.D.   On: 11/13/2017 13:14    Procedures Procedures (including critical care time)  Medications Ordered in ED Medications - No data to display   Initial Impression / Assessment and Plan / ED Course  I have reviewed the triage vital signs and the nursing notes.  Pertinent labs & imaging results that were available during my care of the patient were reviewed by me and considered in my medical decision making (see chart for details).    Patient with pain to the lateral aspect of the left foot for 5 days. He is afebrile, vital signs are stable. He is nontoxic in appearance.  He is neurovascularly intact, ambulatory without difficulty despite pain.  Radiographs show no acute osseous abnormality but does show mild degenerative changes of the first MTP joint.  He has a history of gout in the great toe and states this feels somewhat similar.  Doubt septic joint given full range of motion.  The area is not  erythematous or warm.  Low suspicion of secondary skin infection, osteomyelitis, or DVT. RICE therapy indicated and discussed with patient.  He was given postop shoe for support and crutches.  Recommend follow-up with PCP orthopedist if symptoms persist.  Discussed strict ED return precautions. Pt verbalized understanding of and agreement with plan and is safe for discharge home at this time.   Final Clinical Impressions(s) / ED Diagnoses   Final diagnoses:  Left foot pain    ED Discharge Orders         Ordered    predniSONE (STERAPRED UNI-PAK 21 TAB) 10 MG (21) TBPK tablet  Daily     11/13/17 1325           Jeanie SewerFawze, Londen Lorge A, PA-C 11/14/17 1010    Wynetta FinesMessick, Peter C, MD 11/14/17 2013

## 2017-11-13 NOTE — ED Notes (Signed)
Pt returned from xray

## 2018-03-22 ENCOUNTER — Other Ambulatory Visit: Payer: Self-pay

## 2018-03-22 ENCOUNTER — Emergency Department (HOSPITAL_COMMUNITY)
Admission: EM | Admit: 2018-03-22 | Discharge: 2018-03-22 | Disposition: A | Discharge status: Home / Self Care | Payer: Self-pay | Attending: Emergency Medicine | Admitting: Emergency Medicine

## 2018-03-22 ENCOUNTER — Encounter (HOSPITAL_COMMUNITY): Payer: Self-pay | Admitting: Emergency Medicine

## 2018-03-22 DIAGNOSIS — I10 Essential (primary) hypertension: Secondary | ICD-10-CM | POA: Insufficient documentation

## 2018-03-22 DIAGNOSIS — Z87891 Personal history of nicotine dependence: Secondary | ICD-10-CM | POA: Insufficient documentation

## 2018-03-22 DIAGNOSIS — J4531 Mild persistent asthma with (acute) exacerbation: Secondary | ICD-10-CM | POA: Insufficient documentation

## 2018-03-22 DIAGNOSIS — Z79899 Other long term (current) drug therapy: Secondary | ICD-10-CM | POA: Insufficient documentation

## 2018-03-22 MED ORDER — PREDNISONE 20 MG PO TABS
60.0000 mg | ORAL_TABLET | Freq: Once | ORAL | Status: AC
  Administered 2018-03-22: 60 mg via ORAL
  Filled 2018-03-22: qty 3

## 2018-03-22 MED ORDER — FLUTICASONE-SALMETEROL 115-21 MCG/ACT IN AERO: 2.0000 | INHALATION_SPRAY | Freq: Two times a day (BID) | RESPIRATORY_TRACT | 0 refills | Status: DC

## 2018-03-22 MED ORDER — IPRATROPIUM-ALBUTEROL 0.5-2.5 (3) MG/3ML IN SOLN
3.0000 mL | Freq: Once | RESPIRATORY_TRACT | Status: AC
  Administered 2018-03-22: 3 mL via RESPIRATORY_TRACT
  Filled 2018-03-22: qty 3

## 2018-03-22 MED ORDER — ALBUTEROL SULFATE HFA 108 (90 BASE) MCG/ACT IN AERS: 1.0000 | INHALATION_SPRAY | Freq: Four times a day (QID) | RESPIRATORY_TRACT | 0 refills | Status: DC | PRN

## 2018-03-22 MED ORDER — PREDNISONE 20 MG PO TABS: 60.0000 mg | ORAL_TABLET | Freq: Every day | ORAL | 0 refills | Status: AC

## 2018-03-22 NOTE — ED Notes (Signed)
Pt ambulated without difficulty and maintained a sat of 95%

## 2018-03-22 NOTE — Discharge Instructions (Addendum)
Valuated today for asthma exacerbation.  You are given steroids and breathing treatment in the emergency department.  I have discharged you home with steroids as well as inhalers.  Please take as prescribed.  [facility]  You may establish primary care there if you choose so.  Your blood pressure was elevated in the department.  You will need to follow-up with them for reevaluation.  Return to the ED for any new or worsening symptoms.

## 2018-03-22 NOTE — ED Triage Notes (Signed)
Pt reports hx of asthma, reports he is out of his long-acting inhaler x 1.5 months. Pt reports using his rescue inhaler frequently x 1 month. Pt reports he does not have insurance and has been unable to get his medication.

## 2018-03-22 NOTE — ED Provider Notes (Signed)
[facility] EMERGENCY DEPARTMENT Provider Note   CSN: [id number] Arrival date & time: 03/22/18  1525   History   Chief Complaint Chief Complaint  Patient presents with  . Asthma    HPI Shawn Davenport is a 37 y.o. male with past medical history significant for hypertension, allergic rhinitis, asthma who presents for evaluation of asthma exacerbation.  Patient states he has had an increase in shortness of breath x1 week.  Patient states he is out of his albuterol inhaler, Dulera and Adavir x 1 month. Denies recent sick contacts, fever, chills, chest pain tightness, productive cough, abdominal pain, diarrhea, rhinorrhea or nasal congestion.  Patient states this feels typical of his asthma exacerbations.  Denies history of CHF, PE/DVT, lower extremity swelling or pain.  Admits to hospital hospital admissions for asthma exacerbations.  Denies history of needing intubation.  History obtained from patient.  No interpreter was used.  HPI  Past Medical History:  Diagnosis Date  . Asthma   . GERD (gastroesophageal reflux disease)    tums  . History of chicken pox    37yrs old  . Hypertension   . Perennial allergic rhinitis    ragweed, dogs, cats, mold    Patient Active Problem List   Diagnosis Date Noted  . 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 Streptococcal sore throat 11/07/2015  . Asthma with acute exacerbation 03/25/2015  . Elevated blood pressure 07/04/2013  . Obesity 07/04/2013  . Perennial allergic rhinitis   . Asthma     Past Surgical History:  Procedure Laterality Date  . FOOT SURGERY Right   . HAND SURGERY Left 2013   pins for broken 5th MCP  . WRIST SURGERY Left 1999        Home Medications    Prior to Admission medications   Medication Sig Start Date End Date Taking? Authorizing Provider  albuterol (PROVENTIL HFA;VENTOLIN HFA) 108 (90 Base) MCG/ACT inhaler Inhale 1-2 puffs into the lungs every 6 (six)  hours as needed for wheezing or shortness of breath. 03/22/18   Humaira Sculley A, PA-C  fluticasone (FLONASE) 50 MCG/ACT nasal spray Place 1 spray into both nostrils 2 (two) times daily as needed for allergies or rhinitis. 03/25/15   Bobbitt, Heywood Iles, MD  fluticasone-salmeterol (ADVAIR HFA) 409-81 MCG/ACT inhaler Inhale 2 puffs into the lungs 2 (two) times daily. 03/22/18   Shareeka Yim A, PA-C  loratadine (CLARITIN) 10 MG tablet Take 10 mg by mouth daily.    [provider]  methylPREDNISolone (MEDROL DOSEPAK) 4 MG TBPK tablet tad Patient not taking: Reported on 08/15/2017 07/30/17   Eustace Moore, MD  Mometasone Furo-Formoterol Fum [facility] [place]) Inhale into the lungs.    [provider]  NON FORMULARY Place 1 drop into both eyes daily as needed.    [provider]  predniSONE (DELTASONE) 20 MG tablet Take 3 tablets (60 mg total) by mouth daily for 4 days. 03/22/18 03/26/18  Larysa Pall A, PA-C    Family History Family History  Problem Relation Age of Onset  . Cancer Mother 69       breast, bone  . Hyperlipidemia Father   . Hypertension Father   . Diabetes Father   . Diabetes Paternal Uncle   . Hyperlipidemia Paternal Grandfather   . Hypertension Paternal Grandfather     Social History Social History   Tobacco Use  . Smoking status: Former Smoker    Last attempt to quit: 07/04/2008    Years since  quitting: 9.7  . Smokeless tobacco: Never Used  Substance Use Topics  . Alcohol use: Yes    Alcohol/week: 0.0 standard drinks    Comment: once a week  . Drug use: No     Allergies   Other and Penicillins   Review of Systems Review of Systems  Constitutional: Negative.   HENT: Negative.   Respiratory: Positive for shortness of breath. Negative for cough, choking, chest tightness, wheezing and stridor.   Cardiovascular: Negative.   Gastrointestinal: Negative.   Genitourinary: Negative.   Musculoskeletal: Negative.   Skin: Negative.    Neurological: Negative.   All other systems reviewed and are negative.    Physical Exam Updated Vital Signs BP (!) 149/114   Pulse (!) 106   Temp 98.8 F (37.1 C) (Oral)   Resp 16   SpO2 97%   Physical Exam Vitals signs and nursing note reviewed.  Constitutional:      General: He is not in acute distress.    Appearance: He is well-developed. He is not ill-appearing, toxic-appearing or diaphoretic.     Comments: Sitting on exam chair in no acute distress on initial evaluation.  Able to speak in full sentences without difficulty.  HENT:     Head: Normocephalic and atraumatic.     Nose: Nose normal. No congestion or rhinorrhea.     Mouth/Throat:     Mouth: Mucous membranes are moist.  Eyes:     Pupils: Pupils are equal, round, and reactive to light.  Neck:     Musculoskeletal: Normal range of motion and neck supple.  Cardiovascular:     Rate and Rhythm: Regular rhythm. Tachycardia present.     Pulses: Normal pulses.     Heart sounds: Normal heart sounds. No murmur. No friction rub. No gallop.   Pulmonary:     Effort: Pulmonary effort is normal. Prolonged expiration present. No tachypnea, accessory muscle usage, respiratory distress or retractions.     Breath sounds: No stridor, decreased air movement or transmitted upper airway sounds. Examination of the right-upper field reveals wheezing. Examination of the left-upper field reveals wheezing. Examination of the right-middle field reveals wheezing. Examination of the left-middle field reveals wheezing. Examination of the right-lower field reveals wheezing. Examination of the left-lower field reveals wheezing. Wheezing present. No decreased breath sounds, rhonchi or rales.     Comments: Mild diffuse expiratory wheezing.  Able to speak in full sentences without difficulty.  No tachypnea.  No accessory muscle usage.  Prolonged expiration. Chest:     Chest wall: No tenderness.  Abdominal:     General: There is no distension.      Palpations: Abdomen is soft.     Tenderness: There is no abdominal tenderness. There is no guarding or rebound.  Musculoskeletal: Normal range of motion.  Skin:    General: Skin is warm and dry.  Neurological:     Mental Status: He is alert.      ED Treatments / Results  Labs (all labs ordered are listed, but only abnormal results are displayed) Labs Reviewed - No data to display  EKG None  Radiology No results found.  Procedures Procedures (including critical care time)  Medications Ordered in ED Medications  predniSONE (DELTASONE) tablet 60 mg (60 mg Oral Given 03/22/18 1620)  ipratropium-albuterol (DUONEB) 0.5-2.5 (3) MG/3ML nebulizer solution 3 mL (3 mLs Nebulization Given 03/22/18 1621)     Initial Impression / Assessment and Plan / ED Course  I have reviewed the triage vital signs and  the nursing notes.  Pertinent labs & imaging results that were available during my care of the patient were reviewed by me and considered in my medical decision making (see chart for details).  37 year old who appears otherwise well presents for evaluation of asthma exacerbation.  Symptom onset 1 week ago.  No upper respiratory symptoms.  Patient was previously on albuterol inhaler, Advair, however is been out for over 1 month.  Admits to multiple hospital admissions for asthma exacerbations.  Denies history of intubation.  Afebrile, nonseptic, non-ill-appearing.  Mild diffuse expiratory wheezing with prolonged expiration.  Able to speak in full sentences without difficulty.  Oxygen saturation 97% on room air with good waveform.  No tachypnea.  Will steroids and nebulizer and reevaluate.  1715: Patient clear lung sounds.  States "I feel better."  Will trial ambulation and reevaluate.   1745: Patient able to ambulate in department without difficulty and maintaining oxygen saturations above 95%.  Will DC home on steroids as well as inhalers.  Discussed follow-up with PCP for reevaluation of  asthma as well as his blood pressure.  Patient has had elevated blood pressure in department.  Has history of high blood pressure, however is asymptomatic without headache, vision changes, chest pain or shortness of breath.  Patient is hemodynamically stable and appropriate for DC home at this time.  On reevaluation his lungs are clear.  Able to speak in full sentences without difficulty.  No signs of acute respiratory distress.  Discussed follow-up for reevaluation.  Discussed strict return precautions.  Patient voiced understanding and is agreeable for follow-up.  Final Clinical Impressions(s) / ED Diagnoses   Final diagnoses:  Mild persistent asthma with exacerbation  Hypertension, unspecified type    ED Discharge Orders         Ordered    predniSONE (DELTASONE) 20 MG tablet  Daily     03/22/18 1808    albuterol (PROVENTIL HFA;VENTOLIN HFA) 108 (90 Base) MCG/ACT inhaler  Every 6 hours PRN     03/22/18 1808    fluticasone-salmeterol (ADVAIR HFA) 115-21 MCG/ACT inhaler  2 times daily    Note to Pharmacy:  No additional refills, patient needs ov.   03/22/18 1808           Tauriel Scronce A, PA-C 03/22/18 1819    Melene PlanFloyd, Dan, DO 03/22/18 1837

## 2018-03-23 MED FILL — !VENTOLIN HFA INHALER: 108 (90 BAS | 25 days supply | Qty: 18 | Fill #0

## 2018-03-23 MED FILL — !ADVAIR HFA 115-21 MCG INHA: 115-21 | 30 days supply | Qty: 12 | Fill #0

## 2018-10-08 ENCOUNTER — Other Ambulatory Visit: Payer: Self-pay

## 2018-10-08 ENCOUNTER — Emergency Department (HOSPITAL_COMMUNITY)
Admission: EM | Admit: 2018-10-08 | Discharge: 2018-10-08 | Disposition: A | Discharge status: Home / Self Care | Payer: Self-pay | Attending: Emergency Medicine | Admitting: Emergency Medicine

## 2018-10-08 ENCOUNTER — Encounter (HOSPITAL_COMMUNITY): Payer: Self-pay

## 2018-10-08 DIAGNOSIS — Z79899 Other long term (current) drug therapy: Secondary | ICD-10-CM | POA: Insufficient documentation

## 2018-10-08 DIAGNOSIS — Z87891 Personal history of nicotine dependence: Secondary | ICD-10-CM | POA: Insufficient documentation

## 2018-10-08 DIAGNOSIS — I10 Essential (primary) hypertension: Secondary | ICD-10-CM | POA: Insufficient documentation

## 2018-10-08 DIAGNOSIS — Z880 Allergy status to penicillin: Secondary | ICD-10-CM | POA: Insufficient documentation

## 2018-10-08 DIAGNOSIS — J4520 Mild intermittent asthma, uncomplicated: Secondary | ICD-10-CM | POA: Insufficient documentation

## 2018-10-08 MED ORDER — ADVAIR HFA 115-21 MCG/ACT IN AERO: 2.0000 | INHALATION_SPRAY | Freq: Two times a day (BID) | RESPIRATORY_TRACT | 0 refills | Status: DC

## 2018-10-08 MED ORDER — AMLODIPINE BESYLATE 5 MG PO TABS: 5.0000 mg | ORAL_TABLET | Freq: Every day | ORAL | 0 refills | Status: DC

## 2018-10-08 MED ORDER — ALBUTEROL SULFATE HFA 108 (90 BASE) MCG/ACT IN AERS: 1.0000 | INHALATION_SPRAY | Freq: Four times a day (QID) | RESPIRATORY_TRACT | 0 refills | Status: AC | PRN

## 2018-10-08 MED ORDER — ALBUTEROL SULFATE HFA 108 (90 BASE) MCG/ACT IN AERS
2.0000 | INHALATION_SPRAY | Freq: Once | RESPIRATORY_TRACT | Status: AC
  Administered 2018-10-08: 21:00:00 2 via RESPIRATORY_TRACT
  Filled 2018-10-08: qty 6.7

## 2018-10-08 NOTE — ED Provider Notes (Signed)
[facility] EMERGENCY DEPARTMENT Provider Note   CSN: [id number] Arrival date & time: 10/08/18  2019     History   Chief Complaint Chief Complaint  Patient presents with  . Asthma    HPI Shawn Davenport is a 38 y.o. male with history of asthma who presents for refill of Advair and albuterol inhaler.  Patient reports he has had intermittent wheezing, chest tightness, and mild shortness of breath over the past few days since he has been out of his Advair 4 days ago.  He also takes loratadine and Flonase for allergies.  He denies any chest pain, fever, cough.  Patient does not currently have a PCP.  Patient's blood pressure is elevated today and he states it is elevated most times he has, over the past year.  He is not currently on any medication for it.  He denies any urinary symptoms, headaches, chest pain.     HPI  Past Medical History:  Diagnosis Date  . Asthma   . GERD (gastroesophageal reflux disease)    tums  . History of chicken pox    38yrs old  . Hypertension   . Perennial allergic rhinitis    ragweed, dogs, cats, mold    Patient Active Problem List   Diagnosis Date Noted  . 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 Streptococcal sore throat 11/07/2015  . Asthma with acute exacerbation 03/25/2015  . Elevated blood pressure 07/04/2013  . Obesity 07/04/2013  . Perennial allergic rhinitis   . Asthma     Past Surgical History:  Procedure Laterality Date  . FOOT SURGERY Right   . HAND SURGERY Left 2013   pins for broken 5th MCP  . WRIST SURGERY Left 1999        Home Medications    Prior to Admission medications   Medication Sig Start Date End Date Taking? Authorizing Provider  albuterol (VENTOLIN HFA) 108 (90 Base) MCG/ACT inhaler Inhale 1-2 puffs into the lungs every 6 (six) hours as needed for wheezing or shortness of breath. 10/08/18   Kendall Arnell, Bea Graff, PA-C  amLODipine (NORVASC) 5 MG tablet Take 1 tablet  (5 mg total) by mouth daily. 10/08/18   Amara Manalang, Bea Graff, PA-C  fluticasone (FLONASE) 50 MCG/ACT nasal spray Place 1 spray into both nostrils 2 (two) times daily as needed for allergies or rhinitis. 03/25/15   Bobbitt, Sedalia Muta, MD  fluticasone-salmeterol (ADVAIR HFA) 784-69 MCG/ACT inhaler Inhale 2 puffs into the lungs 2 (two) times daily. 10/08/18   Trequan Marsolek, Bea Graff, PA-C  loratadine (CLARITIN) 10 MG tablet Take 10 mg by mouth daily.    [provider]  NON FORMULARY Place 1 drop into both eyes daily as needed.    [provider]    Family History Family History  Problem Relation Age of Onset  . Cancer Mother 35       breast, bone  . Hyperlipidemia Father   . Hypertension Father   . Diabetes Father   . Diabetes Paternal Uncle   . Hyperlipidemia Paternal Grandfather   . Hypertension Paternal Grandfather     Social History Social History   Tobacco Use  . Smoking status: Former Smoker    Quit date: 07/04/2008    Years since quitting: 10.2  . Smokeless tobacco: Never Used  Substance Use Topics  . Alcohol use: Yes    Alcohol/week: 0.0 standard drinks    Comment: once a week  . Drug use: No     Allergies  Other and Penicillins   Review of Systems Review of Systems  Constitutional: Negative for fever.  Respiratory: Positive for chest tightness, shortness of breath and wheezing. Negative for cough.   Genitourinary: Negative for difficulty urinating and dysuria.  Neurological: Negative for headaches.     Physical Exam Updated Vital Signs BP (!) 174/110 (BP Location: Left Arm)   Pulse 96   Temp 98.9 F (37.2 C) (Oral)   Resp 18   SpO2 98%   Physical Exam Vitals signs and nursing note reviewed.  Constitutional:      General: He is not in acute distress.    Appearance: He is well-developed. He is not diaphoretic.  HENT:     Head: Normocephalic and atraumatic.     Mouth/Throat:     Pharynx: No oropharyngeal exudate.  Eyes:     General: No  scleral icterus.       Right eye: No discharge.        Left eye: No discharge.     Conjunctiva/sclera: Conjunctivae normal.     Pupils: Pupils are equal, round, and reactive to light.  Neck:     Musculoskeletal: Normal range of motion and neck supple.     Thyroid: No thyromegaly.  Cardiovascular:     Rate and Rhythm: Normal rate and regular rhythm.     Heart sounds: Normal heart sounds. No murmur. No friction rub. No gallop.   Pulmonary:     Effort: Pulmonary effort is normal. No respiratory distress.     Breath sounds: Normal breath sounds. No stridor. No wheezing or rales.  Abdominal:     General: Bowel sounds are normal. There is no distension.     Palpations: Abdomen is soft.     Tenderness: There is no abdominal tenderness. There is no guarding or rebound.  Lymphadenopathy:     Cervical: No cervical adenopathy.  Skin:    General: Skin is warm and dry.     Coloration: Skin is not pale.     Findings: No rash.  Neurological:     Mental Status: He is alert.     Coordination: Coordination normal.      ED Treatments / Results  Labs (all labs ordered are listed, but only abnormal results are displayed) Labs Reviewed - No data to display  EKG None  Radiology No results found.  Procedures Procedures (including critical care time)  Medications Ordered in ED Medications  albuterol (VENTOLIN HFA) 108 (90 Base) MCG/ACT inhaler 2 puff (2 puffs Inhalation Given 10/08/18 2031)     Initial Impression / Assessment and Plan / ED Course  I have reviewed the triage vital signs and the nursing notes.  Pertinent labs & imaging results that were available during my care of the patient were reviewed by me and considered in my medical decision making (see chart for details).        Patient presenting for medication refill with mild asthma symptoms.  Patient given albuterol puffs in the ED and is feeling back to baseline.  Ambulated with pulse ox with oxygen saturations above 95%  without shortness of breath.  Patient also noted to have hypertension.  Per chart review, patient has had hypertension at every visit.  He has no signs of hypertensive emergency.  Will start Norvasc 5 mg.  Patient encouraged to eat a low-salt diet and begin exercising.  Given information to follow-up with PCP.  He states [facility].  Patient given alternative to patient care clinic.  Return precautions discussed.  Patient understands and agrees with plan.  Patient discharged in satisfactory condition.  Final Clinical Impressions(s) / ED Diagnoses   Final diagnoses:  Mild intermittent asthma without complication  Essential hypertension    ED Discharge Orders         Ordered    fluticasone-salmeterol (ADVAIR HFA) 115-21 MCG/ACT inhaler  2 times daily    Note to Pharmacy: No additional refills, patient needs ov.   10/08/18 2108    albuterol (VENTOLIN HFA) 108 (90 Base) MCG/ACT inhaler  Every 6 hours PRN     10/08/18 2108    amLODipine (NORVASC) 5 MG tablet  Daily,   Status:  Discontinued     10/08/18 2108    amLODipine (NORVASC) 5 MG tablet  Daily     10/08/18 2111           Emi HolesLaw, Annalynne Ibanez M, PA-C 10/08/18 2126    Cathren LaineSteinl, Kevin, MD 10/08/18 2248

## 2018-10-08 NOTE — ED Triage Notes (Signed)
Patient reports he is out of his albuterol as well as his advair. He reports using otc inhaler without relief. No distress noted. Denies any sick contacts.

## 2018-10-08 NOTE — Discharge Instructions (Addendum)
Begin taking amlodipine daily as prescribed for your blood pressure.  The dose may need to be increased or the medication change depending upon your body's response.  You will need to follow-up and establish care with a primary care provider for further monitoring of your blood pressure and further prescriptions.  Please return department you develop any new or worsening symptoms.

## 2018-10-08 NOTE — ED Notes (Signed)
Pt's pulse ox maintained 95-97% while ambulating

## 2018-10-09 MED FILL — ALBUTEROL SULFATE HFA 108 (: 108 (90 BAS | 25 days supply | Qty: 18 | Fill #0

## 2018-10-09 MED FILL — ADVAIR HFA 115-21 MCG INH: 115-21 | 30 days supply | Qty: 12 | Fill #0

## 2018-10-09 MED FILL — AMLODIPINE BESYLATE 5 MG TA: 5 | 30 days supply | Qty: 30 | Fill #0

## 2018-12-18 ENCOUNTER — Encounter (HOSPITAL_COMMUNITY): Payer: Self-pay | Admitting: Emergency Medicine

## 2018-12-18 ENCOUNTER — Ambulatory Visit (HOSPITAL_COMMUNITY)
Admission: EM | Admit: 2018-12-18 | Discharge: 2018-12-18 | Disposition: A | Discharge status: Home / Self Care | Payer: Self-pay | Attending: Family Medicine | Admitting: Family Medicine

## 2018-12-18 ENCOUNTER — Other Ambulatory Visit: Payer: Self-pay

## 2018-12-18 DIAGNOSIS — I10 Essential (primary) hypertension: Secondary | ICD-10-CM

## 2018-12-18 DIAGNOSIS — S39012A Strain of muscle, fascia and tendon of lower back, initial encounter: Secondary | ICD-10-CM

## 2018-12-18 MED ORDER — AMLODIPINE BESYLATE 5 MG PO TABS: 5.0000 mg | ORAL_TABLET | Freq: Every day | ORAL | 1 refills | Status: DC

## 2018-12-18 MED ORDER — CYCLOBENZAPRINE HCL 10 MG PO TABS: ORAL_TABLET | ORAL | 0 refills | Status: DC

## 2018-12-18 NOTE — ED Triage Notes (Signed)
PT reports left lower back pain that started when he bent over to tie his shoe.

## 2018-12-18 NOTE — Discharge Instructions (Signed)

## 2018-12-18 NOTE — ED Notes (Signed)
PT has been out of BP meds for a week. These were prescribed from an ED visit. PT would like refill so he has coverage while he looks for a PCP

## 2018-12-18 NOTE — ED Provider Notes (Signed)
[facility]ARE [facility]   [id number] 12/18/18 Arrival Time: 1747  ASSESSMENT & PLAN:  1. Strain of lumbar region, initial encounter   2. Uncontrolled hypertension     Able to ambulate here and hemodynamically stable. No indication for imaging of back at this time given no trauma and normal neurological exam. Discussed.  Meds ordered this encounter  Medications  . cyclobenzaprine (FLEXERIL) 10 MG tablet    Sig: Take 1 tablet by mouth 3 times daily as needed for muscle spasm. Warning: May cause drowsiness.    Dispense:  21 tablet    Refill:  0  . amLODipine (NORVASC) 5 MG tablet    Sig: Take 1 tablet (5 mg total) by mouth daily.    Dispense:  30 tablet    Refill:  1    Medication sedation precautions given. Encourage ROM/movement as tolerated. Refilled BP medication at his request.  Recommend: Follow-up Information    Schedule an appointment as soon as possible for a visit  with Primary Care at [facility].   Specialty: Family Medicine Contact information: [address], [address] [place] [address] [phone]         Return here if back not improving over the next several days. Work note provided.  Reviewed expectations re: course of current medical issues. Questions answered. Outlined signs and symptoms indicating need for more acute intervention. Patient verbalized understanding. After Visit Summary given.   SUBJECTIVE: History from: patient.  Shawn Davenport is a 38 y.o. male who presents with complaint of persistent left sided lower back discomfort. Onset abrupt, first noted yesterday. Injury/trama: reports feeling immediate pain when bending forward to tie his shoe. History of back problems: rare. Discomfort described as aching and with occasional sharp pain; without radiation. Pain is worse with prolonged walking/standing, worse with movements involving back, and unchanged with rest. Progressive LE weakness or saddle anesthesia: none.  Extremity sensation changes or weakness: none. Ambulatory without difficulty. Normal bowel/bladder habits: yes; without urinary retention. No associated abdominal pain/n/v. Self treatment: has tried 400mg  ibuprofen a couple of times; some help.  Reports no chronic steroid use, fevers, IV drug use, or recent back surgeries or procedures.  Increased blood pressure noted today. Reports that he has been treated for hypertension in the past. Out of amlodipine. Requests refill.  He reports no chest pain on exertion, no dyspnea on exertion, no swelling of ankles, no orthostatic dizziness or lightheadedness, no orthopnea or paroxysmal nocturnal dyspnea, no palpitations and no intermittent claudication symptoms.  ROS: As per HPI. All other systems negative.   OBJECTIVE:  Vitals:   12/18/18 1814  BP: (!) 187/110  Pulse: 89  Resp: 16  Temp: 98.2 F (36.8 C)  TempSrc: Temporal  SpO2: 97%    General appearance: alert; no distress HEENT: [place]; AT Neck: supple with FROM; without midline tenderness CV: RRR Lungs: unlabored respirations; symmetrical air entry Abdomen: soft, non-tender; non-distended Back: poorly localized tenderness to palpation over lower left paraspinal musculature; FROM at waist; bruising: none; without midline tenderness Extremities: no edema; symmetrical with no gross deformities; normal ROM of bilateral LE Skin: warm and dry Neurologic: normal gait; normal reflexes of bilateral LE; normal sensation of bilateral LE; normal strength of bilateral LE Psychological: alert and cooperative; normal mood and affect   Allergies  Allergen Reactions  . Other     MANGO CAUSES LIP SWELLING  . Penicillins Other (See Comments)    Has patient had a PCN reaction causing immediate rash, facial/tongue/throat swelling, SOB  or lightheadedness with hypotension: No Has patient had a PCN reaction causing severe rash involving mucus membranes or skin necrosis: No Has patient had a PCN reaction  that required hospitalization No Has patient had a PCN reaction occurring within the last 10 years: No If all of the above answers are "NO", then may proceed with Cephalosporin use.     Past Medical History:  Diagnosis Date  . Asthma   . GERD (gastroesophageal reflux disease)    tums  . History of chicken pox    38yrs old  . Hypertension   . Perennial allergic rhinitis    ragweed, dogs, cats, mold   Social History   Socioeconomic History  . Marital status: Married    Spouse name: Not on file  . Number of children: Not on file  . Years of education: Not on file  . Highest education level: Not on file  Occupational History  . Not on file  Social Needs  . Financial resource strain: Not on file  . Food insecurity    Worry: Not on file    Inability: Not on file  . Transportation needs    Medical: Not on file    Non-medical: Not on file  Tobacco Use  . Smoking status: Former Smoker    Quit date: 07/04/2008    Years since quitting: 10.4  . Smokeless tobacco: Never Used  Substance and Sexual Activity  . Alcohol use: Yes    Alcohol/week: 0.0 standard drinks    Comment: once a week  . Drug use: No  . Sexual activity: Not Currently  Lifestyle  . Physical activity    Days per week: Not on file    Minutes per session: Not on file  . Stress: Not on file  Relationships  . Social Herbalist on phone: Not on file    Gets together: Not on file    Attends religious service: Not on file    Active member of club or organization: Not on file    Attends meetings of clubs or organizations: Not on file    Relationship status: Not on file  . Intimate partner violence    Fear of current or ex partner: Not on file    Emotionally abused: Not on file    Physically abused: Not on file    Forced sexual activity: Not on file  Other Topics Concern  . Not on file  Social History Narrative   Lives with wife and daughter (2013), 1 dog   Occupation: Producer, television/film/video for [employer]    Edu: HS   Activity - yardwork   Diet - good water, gatorade 16 oz at work, fruits/vegetables daily   Family History  Problem Relation Age of Onset  . Cancer Mother 32       breast, bone  . Hyperlipidemia Father   . Hypertension Father   . Diabetes Father   . Diabetes Paternal Uncle   . Hyperlipidemia Paternal Grandfather   . Hypertension Paternal Grandfather    Past Surgical History:  Procedure Laterality Date  . FOOT SURGERY Right   . HAND SURGERY Left 2013   pins for broken 5th MCP  . WRIST SURGERY Left 1999     Vanessa Kick, MD 12/18/18 [phone]

## 2019-09-02 IMAGING — CR DG FOOT COMPLETE 3+V*L*
3 series · 3 of 3 positions shown · non-contrast
Comparison: Left great toe 09/09/2013

CLINICAL DATA: Pain and swelling lateral foot

EXAM:
LEFT FOOT - COMPLETE 3+ VIEW

[foot ap]
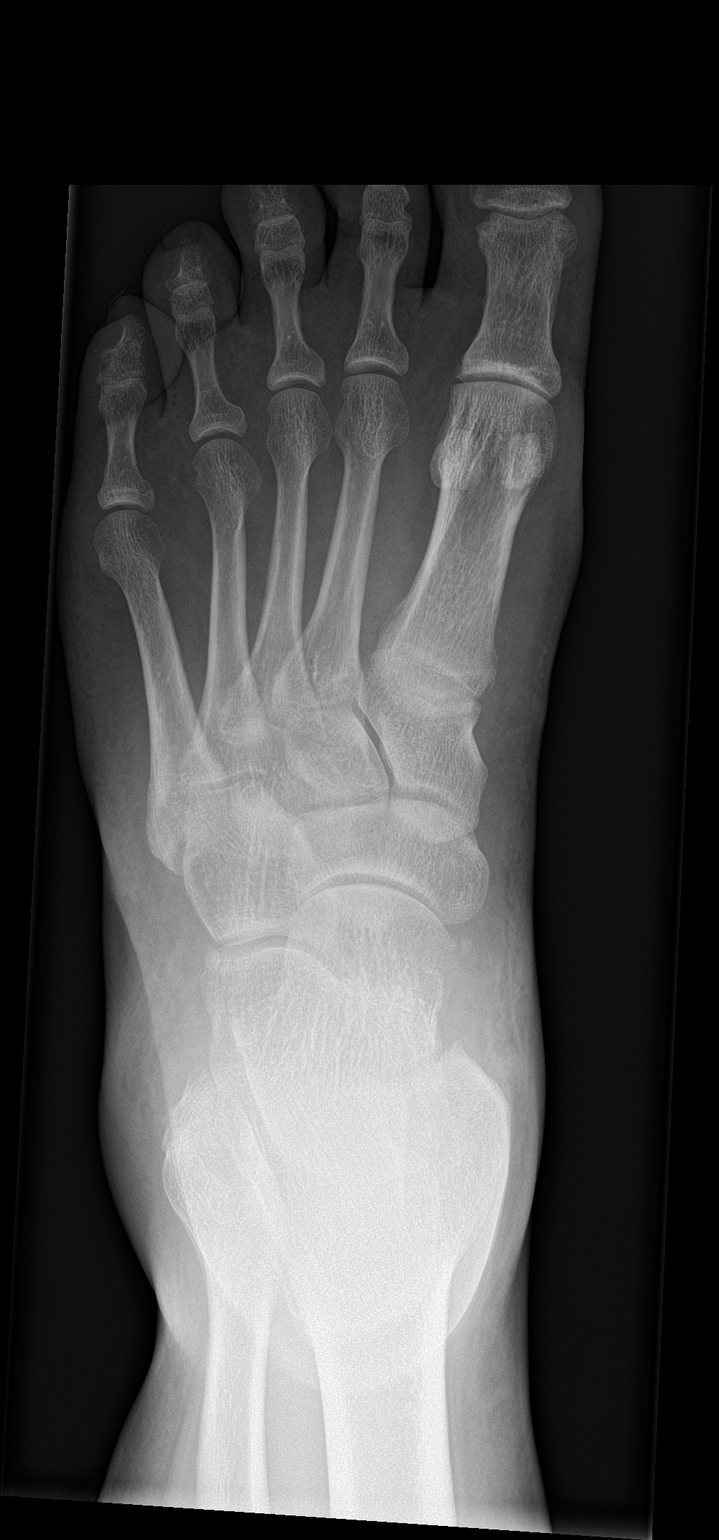

[foot obl]
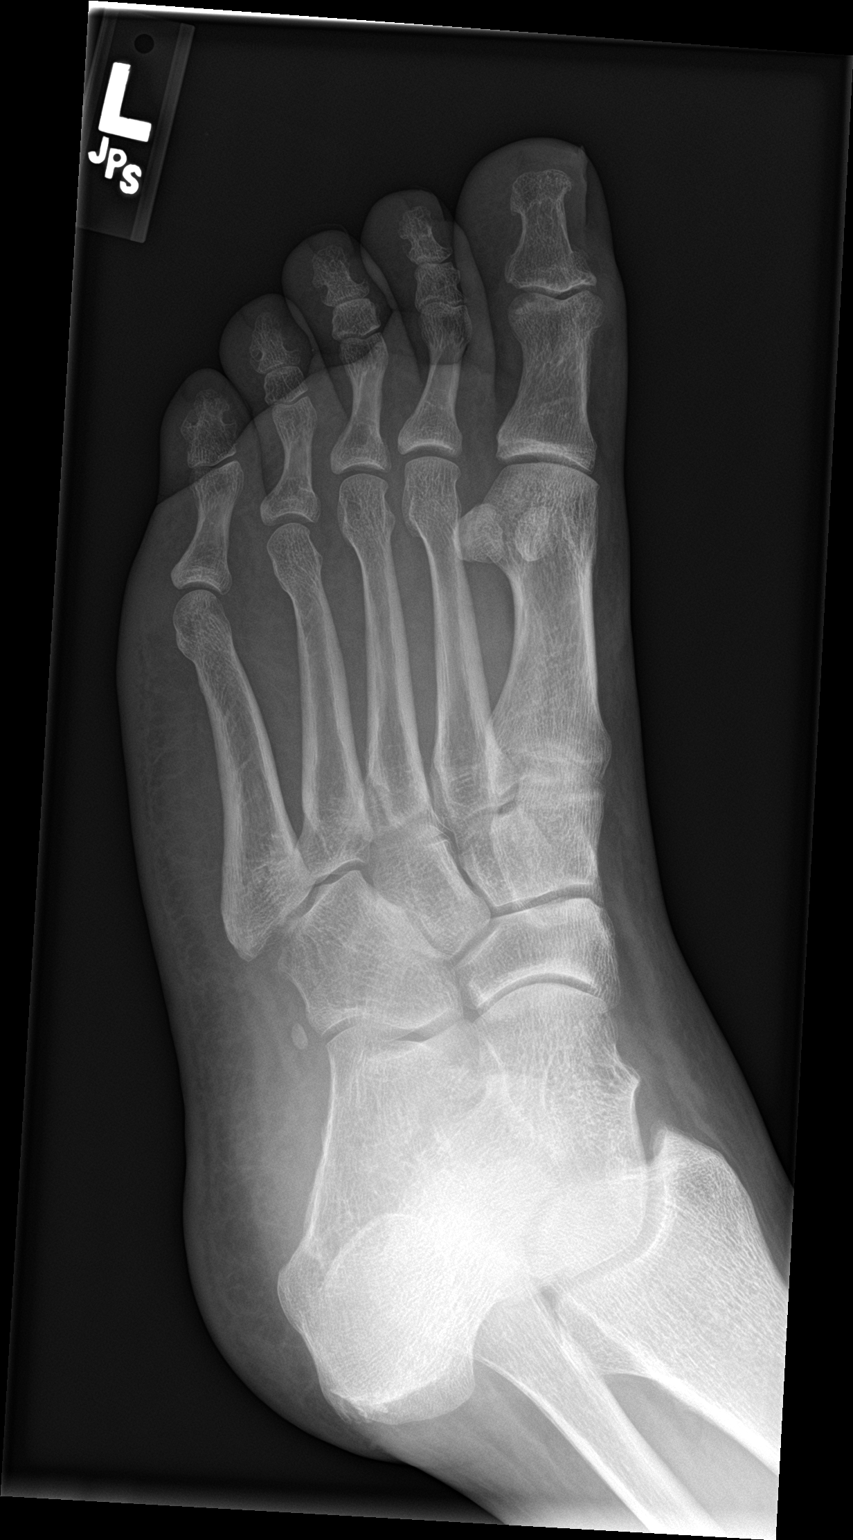

[foot lat]
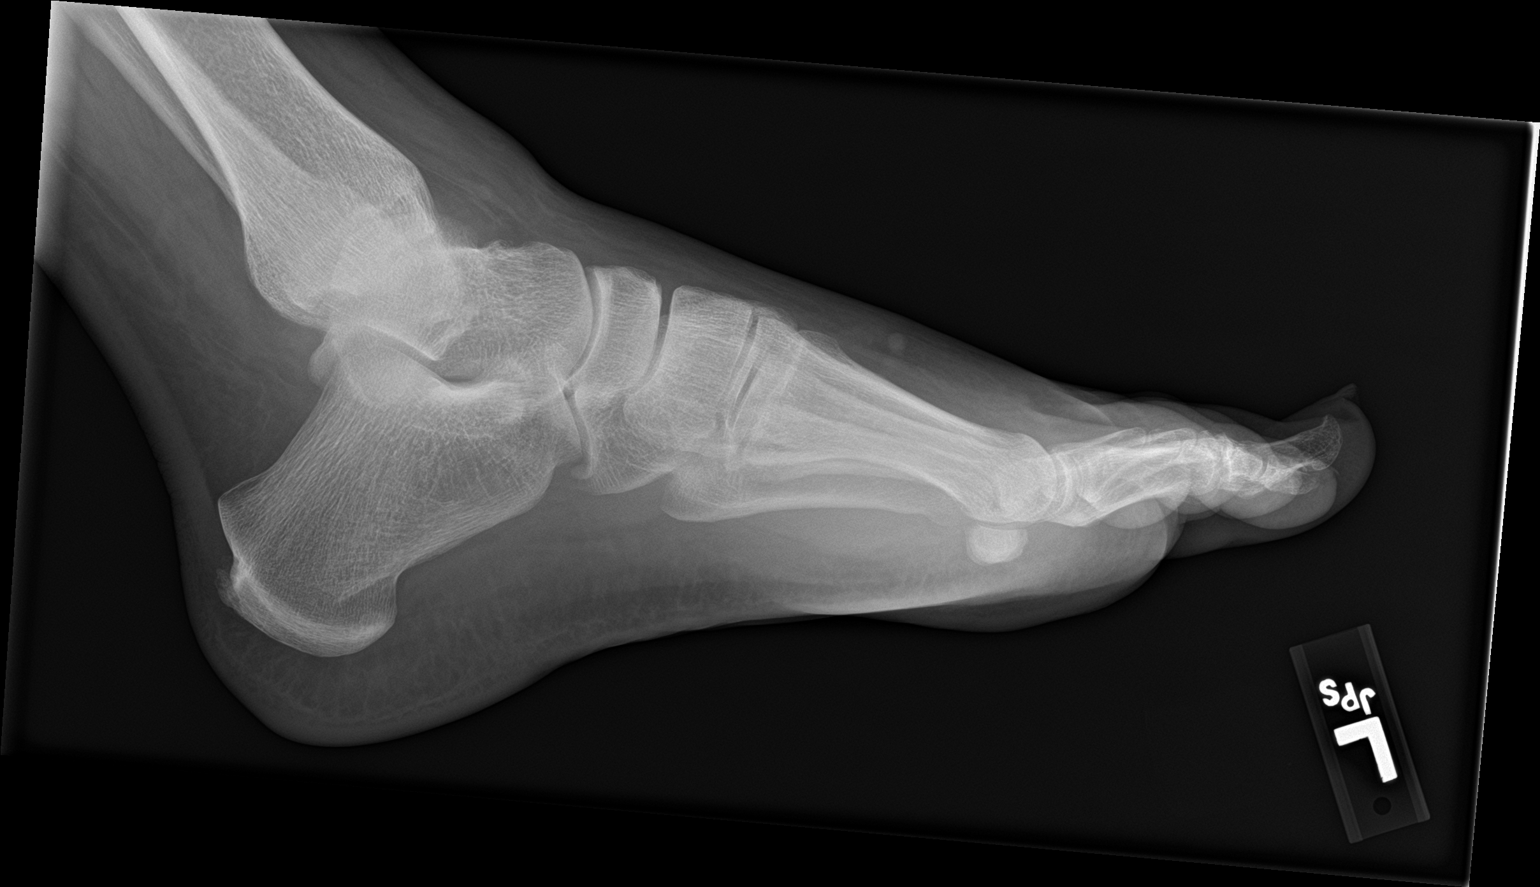

[3 of 3 positions shown; findings below may reference images not displayed]

FINDINGS: Mild joint space narrowing of the first MTP. No other arthropathy.
No erosion. Negative for fracture
IMPRESSION: Mild degenerative change first MTP

## 2019-10-16 ENCOUNTER — Ambulatory Visit
Admission: EM | Admit: 2019-10-16 | Discharge: 2019-10-16 | Disposition: A | Discharge status: Home / Self Care | Payer: Self-pay

## 2019-10-16 DIAGNOSIS — R110 Nausea: Secondary | ICD-10-CM

## 2019-10-16 DIAGNOSIS — K529 Noninfective gastroenteritis and colitis, unspecified: Secondary | ICD-10-CM

## 2019-10-16 DIAGNOSIS — R197 Diarrhea, unspecified: Secondary | ICD-10-CM

## 2019-10-16 NOTE — Discharge Instructions (Signed)
Try immodium for the diarrhea  Also make sure that you are drinking plenty of fluids  Follow up with this office or with primary care as needed  Follow up with the ER for inability to keep down fluids, high fever, other concerning symptoms

## 2019-10-16 NOTE — ED Triage Notes (Signed)
Pt c/o diarrhea and nausea since Monday. States having chills and cold sweats at time. Denies any abdominal pain.

## 2019-10-16 NOTE — ED Provider Notes (Signed)
[facility]   [id number] 10/16/19 Arrival Time: 1803  CC: ABDOMINAL PAIN  SUBJECTIVE:  Shawn Davenport is a 39 y.o. male who presents with complaint of abdominal discomfort that began abruptly three days ago. Reports nausea without vomiting and diarrhea. Denies a precipitating event, trauma, close contacts with similar symptoms, recent travel or antibiotic use.  Describes as intermittent and improving in character.  Has not taken OTC medications for this. Denies alleviating or aggravating factors. Denies similar symptoms in the past. Last BM today.    Denies fever, chills, appetite changes, weight changes, vomiting, chest pain, SOB, constipation, hematochezia, melena, dysuria, difficulty urinating, increased frequency or urgency, flank pain, loss of bowel or bladder function ROS: As per HPI.  All other pertinent ROS negative.     Past Medical History:  Diagnosis Date  . Asthma   . GERD (gastroesophageal reflux disease)    tums  . History of chicken pox    39yrs old  . Hypertension   . Perennial allergic rhinitis    ragweed, dogs, cats, mold   Past Surgical History:  Procedure Laterality Date  . FOOT SURGERY Right   . HAND SURGERY Left 2013   pins for broken 5th MCP  . WRIST SURGERY Left 1999   Allergies  Allergen Reactions  . Other     MANGO CAUSES LIP SWELLING  . Penicillins Other (See Comments)    Has patient had a PCN reaction causing immediate rash, facial/tongue/throat swelling, SOB or lightheadedness with hypotension: No Has patient had a PCN reaction causing severe rash involving mucus membranes or skin necrosis: No Has patient had a PCN reaction that required hospitalization No Has patient had a PCN reaction occurring within the last 10 years: No If all of the above answers are "NO", then may proceed with Cephalosporin use.    No current facility-administered medications on file prior to encounter.   Current Outpatient Medications on File Prior to  Encounter  Medication Sig Dispense Refill  . lisinopril (ZESTRIL) 10 MG tablet Take 10 mg by mouth daily.    Marland Kitchen albuterol (VENTOLIN HFA) 108 (90 Base) MCG/ACT inhaler Inhale 1-2 puffs into the lungs every 6 (six) hours as needed for wheezing or shortness of breath. 6.7 g 0  . loratadine (CLARITIN) 10 MG tablet Take 10 mg by mouth daily.    . NON FORMULARY Place 1 drop into both eyes daily as needed.     Social History   Socioeconomic History  . Marital status: Married    Spouse name: Not on file  . Number of children: Not on file  . Years of education: Not on file  . Highest education level: Not on file  Occupational History  . Not on file  Tobacco Use  . Smoking status: Former Smoker    Quit date: 07/04/2008    Years since quitting: 11.2  . Smokeless tobacco: Never Used  Substance and Sexual Activity  . Alcohol use: Yes    Alcohol/week: 0.0 standard drinks    Comment: once a week  . Drug use: No  . Sexual activity: Not Currently  Other Topics Concern  . Not on file  Social History Narrative   Lives with wife and daughter (2013), 1 dog   Occupation: Therapist, music for [employer]   Edu: HS   Activity - yardwork   Diet - good water, gatorade 16 oz at work, fruits/vegetables daily   Social Determinants of Health   Financial Resource Strain:   . Difficulty of  Paying Living Expenses:   Food Insecurity:   . Worried About Programme researcher, broadcasting/film/video in the Last Year:   . Barista in the Last Year:   Transportation Needs:   . Freight forwarder (Medical):   Marland Kitchen Lack of Transportation (Non-Medical):   Physical Activity:   . Days of Exercise per Week:   . Minutes of Exercise per Session:   Stress:   . Feeling of Stress :   Social Connections:   . Frequency of Communication with Friends and Family:   . Frequency of Social Gatherings with Friends and Family:   . Attends Religious Services:   . Active Member of Clubs or Organizations:   . Attends Banker Meetings:    Marland Kitchen Marital Status:   Intimate Partner Violence:   . Fear of Current or Ex-Partner:   . Emotionally Abused:   Marland Kitchen Physically Abused:   . Sexually Abused:    Family History  Problem Relation Age of Onset  . Cancer Mother 32       breast, bone  . Hyperlipidemia Father   . Hypertension Father   . Diabetes Father   . Diabetes Paternal Uncle   . Hyperlipidemia Paternal Grandfather   . Hypertension Paternal Grandfather      OBJECTIVE:  Vitals:   10/16/19 1832  BP: (!) 184/119  Pulse: 99  Resp: 18  Temp: 98.9 F (37.2 C)  TempSrc: Oral  SpO2: 97%    General appearance: Alert; NAD HEENT: NCAT.  Oropharynx clear.  Lungs: clear to auscultation bilaterally without adventitious breath sounds Heart: regular rate and rhythm.  Radial pulses 2+ symmetrical bilaterally Abdomen: soft, non-distended; normal active bowel sounds; non-tender to light and deep palpation; nontender at McBurney's point; negative Murphy's sign; negative rebound; no guarding Back: no CVA tenderness Extremities: no edema; symmetrical with no gross deformities Skin: warm and dry Neurologic: normal gait Psychological: alert and cooperative; normal mood and affect  LABS: No results found for this or any previous visit (from the past 24 hour(s)).  DIAGNOSTIC STUDIES: No results found.   ASSESSMENT & PLAN:  1. Noninfectious gastroenteritis, unspecified type   2. Nausea without vomiting   3. Diarrhea, unspecified type     No orders of the defined types were placed in this encounter.    Get rest and drink fluids May take loperamide as needed for diarrhea   DIET Instructions:  30 minutes after taking nausea medicine, begin with sips of clear liquids. If able to hold down 2 - 4 ounces for 30 minutes, begin drinking more. Increase your fluid intake to replace losses. Clear liquids only for 24 hours (water, tea, sport drinks, clear flat ginger ale or cola and juices, broth, jello, popsicles, ect). Advance  to bland foods, applesauce, rice, baked or boiled chicken, ect. Avoid milk, greasy foods and anything that doesn't agree with you.  If you experience new or worsening symptoms return or go to ER such as fever, chills, nausea, vomiting, diarrhea, bloody or dark tarry stools, constipation, urinary symptoms, worsening abdominal discomfort, symptoms that do not improve with medications, inability to keep fluids down.  Reviewed expectations re: course of current medical issues. Questions answered. Outlined signs and symptoms indicating need for more acute intervention. Patient verbalized understanding. After Visit Summary given.   Moshe Cipro, NP 10/18/19 [phone]

## 2020-05-12 ENCOUNTER — Ambulatory Visit (HOSPITAL_COMMUNITY): Payer: Self-pay

## 2020-12-20 ENCOUNTER — Emergency Department (HOSPITAL_COMMUNITY)
Admission: EM | Admit: 2020-12-20 | Discharge: 2020-12-21 | Disposition: A | Discharge status: Home / Self Care | Payer: Self-pay | Attending: Emergency Medicine | Admitting: Emergency Medicine

## 2020-12-20 ENCOUNTER — Other Ambulatory Visit: Payer: Self-pay

## 2020-12-20 ENCOUNTER — Encounter (HOSPITAL_COMMUNITY): Payer: Self-pay | Admitting: Emergency Medicine

## 2020-12-20 DIAGNOSIS — R319 Hematuria, unspecified: Secondary | ICD-10-CM

## 2020-12-20 DIAGNOSIS — Z79899 Other long term (current) drug therapy: Secondary | ICD-10-CM | POA: Insufficient documentation

## 2020-12-20 DIAGNOSIS — Z87891 Personal history of nicotine dependence: Secondary | ICD-10-CM | POA: Insufficient documentation

## 2020-12-20 DIAGNOSIS — J45909 Unspecified asthma, uncomplicated: Secondary | ICD-10-CM | POA: Insufficient documentation

## 2020-12-20 DIAGNOSIS — I10 Essential (primary) hypertension: Secondary | ICD-10-CM | POA: Insufficient documentation

## 2020-12-20 DIAGNOSIS — N201 Calculus of ureter: Secondary | ICD-10-CM | POA: Insufficient documentation

## 2020-12-20 LAB — URINALYSIS, ROUTINE W REFLEX MICROSCOPIC
Bilirubin Urine: NEGATIVE
Glucose, UA: NEGATIVE mg/dL
Ketones, ur: NEGATIVE mg/dL
Leukocytes,Ua: NEGATIVE
Nitrite: NEGATIVE
Protein, ur: NEGATIVE mg/dL
Specific Gravity, Urine: 1.02 (ref 1.005–1.030)
pH: 6 (ref 5.0–8.0)

## 2020-12-20 LAB — URINALYSIS, MICROSCOPIC (REFLEX)
Bacteria, UA: NONE SEEN
Squamous Epithelial / LPF: NONE SEEN (ref 0–5)

## 2020-12-20 NOTE — ED Triage Notes (Signed)
C/o R flank pain that radiates to R groin with urinary frequency and nausea since noon today.

## 2020-12-21 ENCOUNTER — Emergency Department (HOSPITAL_COMMUNITY): Payer: Self-pay

## 2020-12-21 MED ORDER — OXYCODONE-ACETAMINOPHEN 5-325 MG PO TABS
2.0000 | ORAL_TABLET | Freq: Once | ORAL | Status: AC
  Administered 2020-12-21: 2 via ORAL
  Filled 2020-12-21: qty 2

## 2020-12-21 MED ORDER — ONDANSETRON 4 MG PO TBDP
4.0000 mg | ORAL_TABLET | Freq: Three times a day (TID) | ORAL | 0 refills | Status: AC | PRN
Start: 2020-12-21 — End: ?

## 2020-12-21 MED ORDER — OXYCODONE-ACETAMINOPHEN 5-325 MG PO TABS: 1.0000 | ORAL_TABLET | ORAL | 0 refills | Status: AC | PRN

## 2020-12-21 MED ORDER — TAMSULOSIN HCL 0.4 MG PO CAPS: 0.4000 mg | ORAL_CAPSULE | Freq: Every day | ORAL | 0 refills | Status: AC

## 2020-12-21 MED ORDER — ONDANSETRON 4 MG PO TBDP
4.0000 mg | ORAL_TABLET | Freq: Once | ORAL | Status: AC
Start: 2020-12-21 — End: 2020-12-21
  Administered 2020-12-21: 4 mg via ORAL
  Filled 2020-12-21: qty 1

## 2020-12-21 NOTE — ED Provider Notes (Signed)
[facility] EMERGENCY DEPARTMENT Provider Note   CSN: [id number] Arrival date & time: 12/20/20  1739     History Chief Complaint  Patient presents with   Flank Pain    Shawn Davenport is a 40 y.o. male.  The history is provided by the patient and medical records.  Flank Pain  40 y.o. M with hx of asthma, GERD, HTN, presenting to the ED with right flank pain.  States this was of relatively sudden onset around noon today.  States most of the pain currently he feels in his groin but also shoots up to his back.  Denies any focal testicle pain.  He has been having waves of pain and nausea but has not had any vomiting.  No fever or chills.  Does report urinary frequency and some mild dysuria.  He has not noticed any frank hematuria.  He denies any history of kidney stones in the past.  He does drink a lot of soda/coffee/tea.  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   Streptococcal sore throat 11/07/2015   Asthma with acute exacerbation 03/25/2015   Elevated blood pressure 07/04/2013   Obesity 07/04/2013   Perennial allergic rhinitis    Asthma     Past Surgical History:  Procedure Laterality Date   FOOT SURGERY Right    HAND SURGERY Left 2013   pins for broken 5th MCP   WRIST SURGERY Left 1999       Family History  Problem Relation Age of Onset   Cancer Mother 39       breast, bone   Hyperlipidemia Father    Hypertension Father    Diabetes Father    Diabetes Paternal Uncle    Hyperlipidemia Paternal Grandfather    Hypertension Paternal Grandfather     Social History   Tobacco Use   Smoking status: Former    Types: Cigarettes    Quit date: 07/04/2008     Years since quitting: 12.4   Smokeless tobacco: Never  Substance Use Topics   Alcohol use: Yes    Alcohol/week: 0.0 standard drinks    Comment: once a week   Drug use: No    Home Medications Prior to Admission medications   Medication Sig Start Date End Date Taking? Authorizing Provider  ondansetron (ZOFRAN ODT) 4 MG disintegrating tablet Take 1 tablet (4 mg total) by mouth every 8 (eight) hours as needed for nausea. 12/21/20  Yes Garlon Hatchet, PA-C  oxyCODONE-acetaminophen (PERCOCET) 5-325 MG tablet Take 1 tablet by mouth every 4 (four) hours as needed. 12/21/20  Yes Garlon Hatchet, PA-C  tamsulosin (FLOMAX) 0.4 MG CAPS capsule Take 1 capsule (0.4 mg total) by mouth daily after supper. 12/21/20  Yes Garlon Hatchet, PA-C  albuterol (VENTOLIN HFA) 108 (90 Base) MCG/ACT inhaler Inhale 1-2 puffs into the lungs every 6 (six) hours as needed for wheezing or shortness of breath. 10/08/18   Law, Waylan Boga, PA-C  lisinopril (ZESTRIL) 10 MG tablet Take 10 mg by mouth daily.    [provider]  loratadine (CLARITIN) 10 MG tablet Take 10 mg by mouth daily.    [provider]  NON FORMULARY Place 1 drop into both eyes daily as needed.    [provider]    Allergies    Other and Penicillins  Review of Systems   Review of Systems  Genitourinary:  Positive for dysuria, flank pain and frequency.  All other systems reviewed and are negative.  Physical Exam Updated Vital Signs BP (!) 145/70   Pulse 88   Temp 98.5 F (36.9 C) (Oral)   Resp 17   Ht 5\' 10"  (1.778 m)   Wt 117.9 kg   SpO2 98%   BMI 37.31 kg/m   Physical Exam Vitals and nursing note reviewed.  Constitutional:      Appearance: He is well-developed.  HENT:     Head: Normocephalic and atraumatic.  Eyes:     Conjunctiva/sclera: Conjunctivae normal.     Pupils: Pupils are equal, round, and reactive to light.  Cardiovascular:     Rate and Rhythm: Normal rate and regular rhythm.     Heart  sounds: Normal heart sounds.  Pulmonary:     Effort: Pulmonary effort is normal.     Breath sounds: Normal breath sounds.  Abdominal:     General: Bowel sounds are normal.     Palpations: Abdomen is soft.     Comments: Endorses pain of right flank without focal CVA tenderness  Musculoskeletal:        General: Normal range of motion.     Cervical back: Normal range of motion.  Skin:    General: Skin is warm and dry.  Neurological:     Mental Status: He is alert and oriented to person, place, and time.    ED Results / Procedures / Treatments   Labs (all labs ordered are listed, but only abnormal results are displayed) Labs Reviewed  URINALYSIS, ROUTINE W REFLEX MICROSCOPIC - Abnormal; Notable for the following components:      Result Value   Hgb urine dipstick LARGE (*)    All other components within normal limits  URINALYSIS, MICROSCOPIC (REFLEX)    EKG None  Radiology CT Renal Stone Study  Result Date: 12/21/2020 CLINICAL DATA:  Right side abdominal pain EXAM: CT ABDOMEN AND PELVIS WITHOUT CONTRAST TECHNIQUE: Multidetector CT imaging of the abdomen and pelvis was performed following the standard protocol without IV contrast. COMPARISON:  None. FINDINGS: Lower chest: Lung bases are clear. No effusions. Heart is normal size. Hepatobiliary: Diffuse low-density throughout the liver compatible with fatty infiltration. No focal abnormality. Gallbladder unremarkable. Pancreas: No focal abnormality or ductal dilatation. Spleen: No focal abnormality.  Normal size. Adrenals/Urinary Tract: Right hydronephrosis. Punctate 1 mm right UVJ stone best seen on coronal image 127 of series 6. No additional stones. Adrenal glands and urinary bladder unremarkable. Stomach/Bowel: Normal appendix. Stomach, large and small bowel grossly unremarkable. Vascular/Lymphatic: No evidence of aneurysm or adenopathy. Reproductive: No visible focal abnormality. Other: No free fluid or free air. Musculoskeletal: No  acute bony abnormality. IMPRESSION: Mild right hydronephrosis. Punctate 1 mm right UVJ stone best seen on coronal imaging. Hepatic steatosis. Electronically Signed   By: 12/23/2020 M.D.   On: 12/21/2020 03:03    Procedures Procedures   Medications Ordered in ED Medications  oxyCODONE-acetaminophen (PERCOCET/ROXICET) 5-325 MG per tablet 2 tablet (2 tablets Oral Given 12/21/20 0228)  ondansetron (ZOFRAN-ODT) disintegrating tablet 4 mg (4 mg Oral Given 12/21/20 0229)    ED Course  I have reviewed the triage vital signs and the nursing notes.  Pertinent labs & imaging results that were  available during my care of the patient were reviewed by me and considered in my medical decision making (see chart for details).    MDM Rules/Calculators/A&P                           40 year old male here with right-sided flank pain that began around noon today.  Has had some waves of pain and nausea but no vomiting.  He is afebrile and nontoxic.  Has no focal CVA tenderness on exam but continues to endorse right flank pain with radiation to the groin.  UA with hematuria.  Suspect stone.  CT renal study confirms 1 mm calculus at right UVJ.  Discussed with patient this should likely pass in the next few days, encourage free water intake.  Symptomatic control and urology follow-up.  Work note provided.  Return here for new concerns.  Final Clinical Impression(s) / ED Diagnoses Final diagnoses:  Right ureteral stone  Hematuria, unspecified type    Rx / DC Orders ED Discharge Orders          Ordered    oxyCODONE-acetaminophen (PERCOCET) 5-325 MG tablet  Every 4 hours PRN        12/21/20 0329    ondansetron (ZOFRAN ODT) 4 MG disintegrating tablet  Every 8 hours PRN        12/21/20 0329    tamsulosin (FLOMAX) 0.4 MG CAPS capsule  Daily after supper        12/21/20 0329             Garlon Hatchet, PA-C 12/21/20 7902    Zadie Rhine, MD 12/21/20 [phone]

## 2020-12-21 NOTE — Discharge Instructions (Signed)
Take the prescribed medication as directed.  Try to increase water intake, reduce soda/coffee/tea. Stone should pass within the next few days. Follow-up with urology if ongoing issues. Return to the ED for new or worsening symptoms.
